# Patient Record
Sex: Female | Born: 1955 | Race: White | Hispanic: No | Smoking: Never smoker
Health system: Southern US, Community
[De-identification: ages and names within clinical notes are randomized; demographics above are authoritative.]

## PROBLEM LIST (undated history)

## (undated) DIAGNOSIS — M797 Fibromyalgia: Secondary | ICD-10-CM

## (undated) DIAGNOSIS — I10 Essential (primary) hypertension: Secondary | ICD-10-CM

## (undated) DIAGNOSIS — M8580 Other specified disorders of bone density and structure, unspecified site: Secondary | ICD-10-CM

## (undated) DIAGNOSIS — G510 Bell's palsy: Secondary | ICD-10-CM

## (undated) DIAGNOSIS — E7800 Pure hypercholesterolemia, unspecified: Secondary | ICD-10-CM

## (undated) DIAGNOSIS — B029 Zoster without complications: Secondary | ICD-10-CM

## (undated) DIAGNOSIS — J111 Influenza due to unidentified influenza virus with other respiratory manifestations: Secondary | ICD-10-CM

## (undated) DIAGNOSIS — D649 Anemia, unspecified: Secondary | ICD-10-CM

## (undated) DIAGNOSIS — R079 Chest pain, unspecified: Secondary | ICD-10-CM

## (undated) HISTORY — PX: TUBAL LIGATION: SHX77

## (undated) HISTORY — PX: APPENDECTOMY: SHX54

## (undated) HISTORY — DX: Fibromyalgia: M79.7

## (undated) HISTORY — DX: Chest pain, unspecified: R07.9

## (undated) HISTORY — DX: Anemia, unspecified: D64.9

## (undated) HISTORY — DX: Other specified disorders of bone density and structure, unspecified site: M85.80

## (undated) HISTORY — DX: Zoster without complications: B02.9

## (undated) HISTORY — PX: SHOULDER SURGERY: SHX246

## (undated) HISTORY — DX: Pure hypercholesterolemia, unspecified: E78.00

## (undated) HISTORY — PX: LAPAROSCOPIC CHOLECYSTECTOMY: SUR755

## (undated) HISTORY — PX: COLONOSCOPY: SHX174

## (undated) HISTORY — DX: Bell's palsy: G51.0

## (undated) HISTORY — DX: Essential (primary) hypertension: I10

## (undated) HISTORY — DX: Influenza due to unidentified influenza virus with other respiratory manifestations: J11.1

---

## 1997-05-19 ENCOUNTER — Other Ambulatory Visit: Admission: RE | Admit: 1997-05-19 | Discharge: 1997-05-19 | Payer: Self-pay | Admitting: *Deleted

## 1997-06-19 ENCOUNTER — Other Ambulatory Visit: Admission: RE | Admit: 1997-06-19 | Discharge: 1997-06-19 | Payer: Self-pay | Admitting: *Deleted

## 1997-06-19 ENCOUNTER — Ambulatory Visit (HOSPITAL_COMMUNITY): Admission: RE | Admit: 1997-06-19 | Discharge: 1997-06-19 | Payer: Self-pay | Admitting: *Deleted

## 1998-05-03 DIAGNOSIS — R079 Chest pain, unspecified: Secondary | ICD-10-CM

## 1998-05-03 HISTORY — DX: Chest pain, unspecified: R07.9

## 1998-05-18 ENCOUNTER — Other Ambulatory Visit: Admission: RE | Admit: 1998-05-18 | Discharge: 1998-05-18 | Payer: Self-pay | Admitting: *Deleted

## 1998-05-18 ENCOUNTER — Ambulatory Visit (HOSPITAL_COMMUNITY): Admission: RE | Admit: 1998-05-18 | Discharge: 1998-05-18 | Payer: Self-pay | Admitting: *Deleted

## 1999-06-17 ENCOUNTER — Other Ambulatory Visit: Admission: RE | Admit: 1999-06-17 | Discharge: 1999-06-17 | Payer: Self-pay | Admitting: *Deleted

## 1999-06-17 ENCOUNTER — Encounter: Payer: Self-pay | Admitting: *Deleted

## 1999-06-17 ENCOUNTER — Ambulatory Visit (HOSPITAL_COMMUNITY): Admission: RE | Admit: 1999-06-17 | Discharge: 1999-06-17 | Payer: Self-pay | Admitting: *Deleted

## 2000-08-24 ENCOUNTER — Other Ambulatory Visit: Admission: RE | Admit: 2000-08-24 | Discharge: 2000-08-24 | Payer: Self-pay | Admitting: *Deleted

## 2000-08-24 ENCOUNTER — Ambulatory Visit (HOSPITAL_COMMUNITY): Admission: RE | Admit: 2000-08-24 | Discharge: 2000-08-24 | Payer: Self-pay | Admitting: *Deleted

## 2000-08-24 ENCOUNTER — Encounter: Payer: Self-pay | Admitting: *Deleted

## 2001-08-26 ENCOUNTER — Encounter: Payer: Self-pay | Admitting: *Deleted

## 2001-08-26 ENCOUNTER — Ambulatory Visit (HOSPITAL_COMMUNITY): Admission: RE | Admit: 2001-08-26 | Discharge: 2001-08-26 | Payer: Self-pay | Admitting: *Deleted

## 2001-08-26 ENCOUNTER — Other Ambulatory Visit: Admission: RE | Admit: 2001-08-26 | Discharge: 2001-08-26 | Payer: Self-pay | Admitting: *Deleted

## 2002-01-13 ENCOUNTER — Other Ambulatory Visit: Admission: RE | Admit: 2002-01-13 | Discharge: 2002-01-13 | Payer: Self-pay | Admitting: *Deleted

## 2002-09-03 ENCOUNTER — Ambulatory Visit (HOSPITAL_COMMUNITY): Admission: RE | Admit: 2002-09-03 | Discharge: 2002-09-03 | Payer: Self-pay | Admitting: *Deleted

## 2002-09-03 ENCOUNTER — Encounter: Payer: Self-pay | Admitting: *Deleted

## 2002-09-03 ENCOUNTER — Other Ambulatory Visit: Admission: RE | Admit: 2002-09-03 | Discharge: 2002-09-03 | Payer: Self-pay | Admitting: *Deleted

## 2003-09-09 ENCOUNTER — Ambulatory Visit (HOSPITAL_COMMUNITY): Admission: RE | Admit: 2003-09-09 | Discharge: 2003-09-09 | Payer: Self-pay | Admitting: *Deleted

## 2003-09-09 ENCOUNTER — Other Ambulatory Visit: Admission: RE | Admit: 2003-09-09 | Discharge: 2003-09-09 | Payer: Self-pay | Admitting: *Deleted

## 2004-06-17 ENCOUNTER — Ambulatory Visit: Payer: Self-pay | Admitting: Family Medicine

## 2004-07-06 ENCOUNTER — Ambulatory Visit: Payer: Self-pay | Admitting: Family Medicine

## 2004-08-02 HISTORY — PX: NECK SURGERY: SHX720

## 2004-08-10 ENCOUNTER — Encounter: Admission: RE | Admit: 2004-08-10 | Discharge: 2004-08-10 | Payer: Self-pay | Admitting: Orthopedic Surgery

## 2004-08-30 ENCOUNTER — Observation Stay (HOSPITAL_COMMUNITY): Admission: RE | Admit: 2004-08-30 | Discharge: 2004-08-31 | Payer: Self-pay | Admitting: Orthopedic Surgery

## 2004-08-30 ENCOUNTER — Encounter (INDEPENDENT_AMBULATORY_CARE_PROVIDER_SITE_OTHER): Payer: Self-pay | Admitting: Specialist

## 2004-09-19 ENCOUNTER — Encounter: Admission: RE | Admit: 2004-09-19 | Discharge: 2004-09-19 | Payer: Self-pay | Admitting: Orthopedic Surgery

## 2005-01-03 ENCOUNTER — Ambulatory Visit: Payer: Self-pay | Admitting: Family Medicine

## 2005-04-06 ENCOUNTER — Other Ambulatory Visit: Admission: RE | Admit: 2005-04-06 | Discharge: 2005-04-06 | Payer: Self-pay | Admitting: Obstetrics & Gynecology

## 2005-04-06 ENCOUNTER — Ambulatory Visit (HOSPITAL_COMMUNITY): Admission: RE | Admit: 2005-04-06 | Discharge: 2005-04-06 | Payer: Self-pay | Admitting: Obstetrics & Gynecology

## 2006-04-10 ENCOUNTER — Other Ambulatory Visit: Admission: RE | Admit: 2006-04-10 | Discharge: 2006-04-10 | Payer: Self-pay | Admitting: Obstetrics & Gynecology

## 2006-04-19 ENCOUNTER — Encounter: Admission: RE | Admit: 2006-04-19 | Discharge: 2006-04-19 | Payer: Self-pay | Admitting: Obstetrics & Gynecology

## 2006-04-24 ENCOUNTER — Encounter: Admission: RE | Admit: 2006-04-24 | Discharge: 2006-04-24 | Payer: Self-pay | Admitting: Gastroenterology

## 2007-03-17 ENCOUNTER — Encounter: Admission: RE | Admit: 2007-03-17 | Discharge: 2007-03-17 | Payer: Self-pay | Admitting: Orthopedic Surgery

## 2007-05-01 ENCOUNTER — Other Ambulatory Visit: Admission: RE | Admit: 2007-05-01 | Discharge: 2007-05-01 | Payer: Self-pay | Admitting: Obstetrics & Gynecology

## 2007-05-01 ENCOUNTER — Ambulatory Visit (HOSPITAL_COMMUNITY): Admission: RE | Admit: 2007-05-01 | Discharge: 2007-05-01 | Payer: Self-pay | Admitting: Obstetrics & Gynecology

## 2008-05-13 ENCOUNTER — Other Ambulatory Visit: Admission: RE | Admit: 2008-05-13 | Discharge: 2008-05-13 | Payer: Self-pay | Admitting: Obstetrics & Gynecology

## 2008-05-13 ENCOUNTER — Ambulatory Visit (HOSPITAL_COMMUNITY): Admission: RE | Admit: 2008-05-13 | Discharge: 2008-05-13 | Payer: Self-pay | Admitting: Obstetrics & Gynecology

## 2008-06-25 ENCOUNTER — Ambulatory Visit (HOSPITAL_COMMUNITY): Admission: RE | Admit: 2008-06-25 | Discharge: 2008-06-25 | Payer: Self-pay | Admitting: Cardiovascular Disease

## 2008-08-17 ENCOUNTER — Encounter: Admission: RE | Admit: 2008-08-17 | Discharge: 2008-08-17 | Payer: Self-pay | Admitting: Orthopedic Surgery

## 2009-05-17 ENCOUNTER — Ambulatory Visit (HOSPITAL_COMMUNITY): Admission: RE | Admit: 2009-05-17 | Discharge: 2009-05-17 | Payer: Self-pay | Admitting: Obstetrics & Gynecology

## 2010-05-20 NOTE — Discharge Summary (Signed)
NAMELAURINA, Misty Haney                ACCOUNT NO.:  [id number]   MEDICAL RECORD NO.:  [id number]          PATIENT TYPE:  INP   LOCATION:  5028                         FACILITY:  [facility]   PHYSICIAN:  Nelda Severe, MD      DATE OF BIRTH:  06-07-1955   DATE OF ADMISSION:  08/30/2004  DATE OF DISCHARGE:  08/31/2004                                 DISCHARGE SUMMARY   HOSPITAL COURSE:  This woman was admitted for management of severe neck pain  associated with C6-7 spondylosis.  The day of admission she was taken to the  operating room where anterior cervical disk excision, decompression and  fusion were carried out.  Postoperatively, she vomited continuously during  the night.  On assessment this morning around 8:30, she had stopped vomiting  and was still quite nauseated, and fairly weak.  It was clearly impossible  to arrange for her discharge at that time.   She has been assessed this evening around 7 p.m.  The nausea has cleared.  She has tolerated clear fluids without difficulty.  Her pain control was  satisfactory, actually not taking practically any analgesia at all.   Her wounds look fine.  Her Penrose drain was removed this morning.   At the present time, she may be discharged.   She will be given a prescription for 50 Vicodin one to two q.6 h. p.r.n.   She may shower.  She may remove the iliac crest dressing from her bone graft  harvest site in 2 days' time.  It is currently occluded with OpSite.  She  can get it wet after that and she can get her cervical wound wet at this  point.  She is to remain on a soft diet, avoiding solid chunky foods over  the next week.  She is to call the office for any persistent fever or wound  drainage.   She is to avoid bending or lifting.  She is to wear her Aspen collar with  which she has been provided when she is out of the house.   She is to call the office and make an appointment for the second week in  September for reevaluation  including examination and x-rays.   At the present time, her condition is one of improved pain compared with  what she had preoperatively.  She does have some posterior neck pain at the  present time, but it not severe.  She is ambulatory and able to void, etc.   DIAGNOSIS:  C6-7 spondylosis.   CONDITION ON DISCHARGE:  Improved pain, wound stable, ambulatory.   DISCHARGE MEDICATIONS:  Vicodin as described above.   FOLLOWUP:  As described above.   PRECAUTIONS AND RESTRICTIONS:  As described above.      Nelda Severe, MD  Electronically Signed     MT/MEDQ  D:  08/31/2004  T:  09/01/2004  Job:  [id number]

## 2010-05-20 NOTE — Op Note (Signed)
NAMELEISL, SPURRIER NO.:  [id number]   MEDICAL RECORD NO.:  [id number]          PATIENT TYPE:  INP   LOCATION:  5028                         FACILITY:  [facility]   PHYSICIAN:  Nelda Severe, MD      DATE OF BIRTH:  1955-08-20   DATE OF PROCEDURE:  DATE OF DISCHARGE:                                 OPERATIVE REPORT   SURGEON:  Nelda Severe, M.D.   ASSISTANT:  Barry Dienes, R.N.   PREOPERATIVE DIAGNOSIS:  C6-7 spondylosis.   POSTOPERATIVE DIAGNOSIS:  C6-7 spondylosis.   OPERATIVE PROCEDURE:  Anterior cervical decompression and fusion at C6-7  utilizing interbody cage, autogenous graft and anterior plate and screws at  C6-7.   OPERATIVE NOTE:  The patient was placed under general endotracheal  anesthesia.  Prophylactic antibiotics were administered intravenously.  She  was positioned supine on a regular operating table.  Folded sheets were  placed transversely across the shoulder blades to allow for extension of the  neck.  The head was supported in a foam donut.  The arms were tucked at the  sides.  A bump was placed under the left hip to raise the anterior iliac  crest for bone graft harvest.   The head halter was applied and attached with the sticky edge a 1000 drape.  The anterior cervical area and left anterior iliac crest area were prepped  with DuraPrep and draped in square fashion.  The drapes were secured with  strips of Ioban.   Prior to prepping and draping, a cross-table lateral radiograph had been  taken with an 18-gauge needle taped to the skin in order to aid in judging  the level of the incision.  This approximated the level of C5.  Accordingly, a transverse incision was made a little distally extending from  the midline to the left side just behind the anterior border of the  sternocleidomastoid.  The platysma was cut in line with the incision.  The  anterior border of the sternocleidomastoid was identified and the cervical  fascia  dissected bluntly down to the anterior aspect of the spinal column.  The carotid sheath was palpated and kept laterally.  The esophagus and  trachea were retracted medially with a handheld Cloward retractor.  The  midline fascia was incised with Metzenbaum scissors.  There was one obvious  disk space associated with large anterior spondylophytes, which was presumed  and proved to be the correct level at C6-7.   A cross-table lateral radiograph was taken with a marker needle in the C5-6  disk, and this confirmed our correct level, at one level below the marked  level.   Once the longus colli muscles were mobilized using cutting current, a  shallow line self-retaining retractor was placed deep with the blades placed  deep to the longus colli muscles on both sides.   The anterior spondylophyte was then removed using a Leksell rongeur to  reveal the anterior disk space.  Fragments of disk were then stripped out piecemeal using curettes and  pituitary rongeurs.   A high-speed bur was then used to remove  anterior inferior lip of the C6  vertebra and further remove endplate cartilage posteriorly.  Endplate  cartilage was also removed using the high-speed bur from the upper end plate  of C7.  Posteriorly, burring was carried back until there was a small shelf,  about 1.5 mm thick.  This was then removed with 2-mm Kerrison rongeur from  both C6 and C7 vertebra.  We then exposed the dura deep to the posterior  longitudinal ligament and removed more disk and ligament back to dura.  Foraminotomies were performed bilaterally using a Kerrison rongeur.   While waiting for the localizing radiograph which was taken, we did harvest  anterior iliac crest bone graft from the left side.  This was done through a  short incision made distal to the bone graft harvest site and then mobilized  proximally so as to leave the incision at a point not over a bony  prominence.  The insertion of the external  oblique fascia and gluteal fascia was  mobilized medially and laterally off the top of the crest, about 3-4 cm  posterior to the anterior superior iliac spine.  The top the crest was  fenestrated with a small osteotome and then a narrow gouge and curettes used  to remove a satisfactory quantity of cancellous bone for placement of an  interbody cage.  This wound was then irrigated and the bony defect packed  with Gelfoam.   Having prepared the disk space and decompressed the neural foramina and  spinal canal, sizers for the interbody cages were then tried including  neutral and 5-degree lordotic sizers.  An 8 mm lordotic sizer was chosen.  The cage was then packed with cancellus graft.  While the anesthesiologist  applied longitudinal traction through the head halter, the interbody cage  containing bone graft was gently impacted into place.  Prior to this I had  measured the depth the vertebral bodies, which was only 14 mm.  Therefore  the cage, measuring 11 mm in depth, was not countersunk.   We then chose the appropriate length four-hole plate, which was applied  anteriorly and attached with self-drilling/self-tapping screws.  Cross-table  lateral radiograph was taken to check for position of plate and screws and  was somewhat overpenetrated, least to the extent that it was difficult for  me to evaluate it in the operating room.  It was sent to the x-ray  department, where the radiologist reviewed and reported that the level was  correct, etc.   A quarter-inch Penrose drain was then placed in the wound.  The platysma was  closed using interrupted 2-0 Vicryl suture and the skin closed using a  subcuticular continuous 3-0 running suture.  The drain was secured with a 3-  0 nylon suture.   The fascial layer of the hip wound was closed using interrupted figure-of-  eight 0 Vicryl suture.  The subcutaneous tissue was closed using interrupted 2-0 Vicryl suture and the skin using a  subcuticular 3-0 running Vicryl  suture.  The skin edges of both wounds were reinforced with Steri-Strips.  A  gauze dressing was applied and secured with OpSite to the iliac crest wound  and a gauze dressing applied to the cervical wound and held in place with a  surgical towel placed around the neck and taped in fairly loose fashion.   The patient was then taken off the table, awakened and returned to the  recovery room, where she could actively move and move both upper and lower  extremities without difficulty.  There was no evidence of neurologic deficit  postoperatively.   Blood loss was estimated at 100 mL or less.  There were no intraoperative  complications.  The sponge and needle counts were correct.      Nelda Severe, MD  Electronically Signed     MT/MEDQ  D:  08/31/2004  T:  09/01/2004  Job:  [id number]

## 2010-07-05 ENCOUNTER — Other Ambulatory Visit: Payer: Self-pay | Admitting: Obstetrics & Gynecology

## 2010-07-05 DIAGNOSIS — Z1231 Encounter for screening mammogram for malignant neoplasm of breast: Secondary | ICD-10-CM

## 2010-07-14 ENCOUNTER — Ambulatory Visit (HOSPITAL_COMMUNITY)
Admission: RE | Admit: 2010-07-14 | Discharge: 2010-07-14 | Disposition: A | Discharge status: Home / Self Care | Payer: BC Managed Care – PPO | Source: Ambulatory Visit | Attending: Obstetrics & Gynecology | Admitting: Obstetrics & Gynecology

## 2010-07-14 DIAGNOSIS — Z1231 Encounter for screening mammogram for malignant neoplasm of breast: Secondary | ICD-10-CM

## 2011-10-10 ENCOUNTER — Other Ambulatory Visit: Payer: Self-pay | Admitting: Obstetrics & Gynecology

## 2011-10-10 DIAGNOSIS — Z1231 Encounter for screening mammogram for malignant neoplasm of breast: Secondary | ICD-10-CM

## 2011-10-11 ENCOUNTER — Ambulatory Visit (HOSPITAL_COMMUNITY)
Admission: RE | Admit: 2011-10-11 | Discharge: 2011-10-11 | Disposition: A | Discharge status: Home / Self Care | Payer: BC Managed Care – PPO | Source: Ambulatory Visit | Attending: Obstetrics & Gynecology | Admitting: Obstetrics & Gynecology

## 2011-10-11 DIAGNOSIS — Z1231 Encounter for screening mammogram for malignant neoplasm of breast: Secondary | ICD-10-CM | POA: Insufficient documentation

## 2012-01-03 DIAGNOSIS — J111 Influenza due to unidentified influenza virus with other respiratory manifestations: Secondary | ICD-10-CM

## 2012-01-03 HISTORY — DX: Influenza due to unidentified influenza virus with other respiratory manifestations: J11.1

## 2012-10-14 ENCOUNTER — Encounter: Payer: Self-pay | Admitting: Obstetrics & Gynecology

## 2012-10-14 ENCOUNTER — Ambulatory Visit (INDEPENDENT_AMBULATORY_CARE_PROVIDER_SITE_OTHER): Payer: BC Managed Care – PPO | Admitting: Obstetrics & Gynecology

## 2012-10-14 VITALS — BP 148/82 | HR 72 | Resp 16 | Ht 60.0 in | Wt 142.8 lb

## 2012-10-14 DIAGNOSIS — Z01419 Encounter for gynecological examination (general) (routine) without abnormal findings: Secondary | ICD-10-CM

## 2012-10-14 DIAGNOSIS — E2839 Other primary ovarian failure: Secondary | ICD-10-CM

## 2012-10-14 DIAGNOSIS — Z0000 Encounter for general adult medical examination without abnormal findings: Secondary | ICD-10-CM

## 2012-10-14 MED ORDER — LEVOTHYROXINE SODIUM 50 MCG PO TABS: 50.0000 ug | ORAL_TABLET | Freq: Every day | ORAL | Status: DC

## 2012-10-14 NOTE — Patient Instructions (Signed)

## 2012-10-14 NOTE — Progress Notes (Signed)
57 y.o. G3P2 MarriedCaucasianF here for annual exam.  Doing well.  Oldest granddaughter is a Printmaker at [employer], on [address].  No vaginal bleeding.    Patient's last menstrual period was 01/03/2008.          Sexually active: yes  The current method of family planning is none.    Exercising: yes  walk and yard work Smoker:  no  Health Maintenance: Pap:  10/11/11 WNL/negative HR HPV History of abnormal Pap:  no MMG:  10/11/11 normal Colonoscopy:  2009, neg.  Dr. Loreta Ave 5 years (due to mother with metastatic cancer--unknown primary).  Pt declines now.   BMD:   2009 TDaP:  Will have PCP send record Screening Labs: done today, Hb today: 12.9, Urine today: WBC-trace   reports that she has never smoked. She has never used smokeless tobacco. She reports that she does not drink alcohol or use illicit drugs.  Past Medical History  Diagnosis Date  . Anemia   . Chest pain 5/00    negative cardiac cath  . Fibromyalgia   . Osteopenia   . Hypertension   . Swine flu 1/14    Past Surgical History  Procedure Laterality Date  . Appendectomy    . Tubal ligation    . Laparoscopic cholecystectomy    . Neck surgery  8/06    secondary disc degeneration  . Colonoscopy      Current Outpatient Prescriptions  Medication Sig Dispense Refill  . Coenzyme Q10 (CO Q-10 PO) Take by mouth daily.      . Flaxseed, Linseed, (FLAX SEEDS PO) Take 450 mg by mouth. Two daily      . KRILL OIL PO Take 500 mg by mouth daily.      Marland Kitchen levothyroxine (SYNTHROID, LEVOTHROID) 50 MCG tablet 50 mcg daily.      . Multiple Vitamins-Minerals (MULTIVITAMIN PO) Take by mouth daily.       No current facility-administered medications for this visit.    Family History  Problem Relation Age of Onset  . Diabetes Mother   . Cancer Mother     ovarian, breast, liver-? primary site  . Irritable bowel syndrome Mother   . Breast cancer Maternal Aunt     and stomach cancer  . Lung cancer Father   . Prostate cancer  Paternal Grandfather   . Brain cancer Maternal Grandmother     ROS:  Pertinent items are noted in HPI.  Otherwise, a comprehensive ROS was negative.  Exam:   BP 148/82  Pulse 72  Resp 16  Ht 5' (1.524 m)  Wt 142 lb 12.8 oz (64.774 kg)  BMI 27.89 kg/m2  LMP 01/03/2008  Weight change: -3lbs   Height: 5' (152.4 cm)  Ht Readings from Last 3 Encounters:  10/14/12 5' (1.524 m)    General appearance: alert, cooperative and appears stated age Head: Normocephalic, without obvious abnormality, atraumatic Neck: no adenopathy, supple, symmetrical, trachea midline and thyroid normal to inspection and palpation Lungs: clear to auscultation bilaterally Breasts: normal appearance, no masses or tenderness Heart: regular rate and rhythm Abdomen: soft, non-tender; bowel sounds normal; no masses,  no organomegaly Extremities: extremities normal, atraumatic, no cyanosis or edema Skin: Skin color, texture, turgor normal. No rashes or lesions Lymph nodes: Cervical, supraclavicular, and axillary nodes normal. No abnormal inguinal nodes palpated Neurologic: Grossly normal   Pelvic: External genitalia:  no lesions              Urethra:  normal appearing urethra with no  masses, tenderness or lesions              Bartholins and Skenes: normal                 Vagina: normal appearing vagina with normal color and discharge, no lesions              Cervix: no lesions              Pap taken: no Bimanual Exam:  Uterus:  normal size, contour, position, consistency, mobility, non-tender              Adnexa: normal adnexa and no mass, fullness, tenderness               Rectovaginal: Confirms               Anus:  normal sphincter tone, no lesions  A:  Well Woman with normal exam H/o 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  Ultrasound 08/22/11 was stable.   Osteopenia Elevated lipids Hypothyroidism Borderline hypertension  P:   Mammogram yearly.  Pt will schedule. Aware I am unsure about tetanus  shot.  She it aware to get this with any risk. Rx for levothyroxine qd #90/4 RF TSH, Vit D, Lipids, CMP  pap smear with neg HR HPV 10/13 Order for BMD return annually or prn  An After Visit Summary was printed and given to the patient.

## 2012-10-15 LAB — POCT URINALYSIS DIPSTICK
Blood, UA: NEGATIVE
Glucose, UA: NEGATIVE
pH, UA: 5

## 2012-10-15 LAB — COMPREHENSIVE METABOLIC PANEL
ALT: 11 U/L (ref 0–35)
AST: 17 U/L (ref 0–37)
Alkaline Phosphatase: 55 U/L (ref 39–117)
BUN: 7 mg/dL (ref 6–23)
Calcium: 9.4 mg/dL (ref 8.4–10.5)
Chloride: 104 mEq/L (ref 96–112)
Creat: 0.56 mg/dL (ref 0.50–1.10)
Glucose, Bld: 81 mg/dL (ref 70–99)
Potassium: 3.6 mEq/L (ref 3.5–5.3)
Total Bilirubin: 0.7 mg/dL (ref 0.3–1.2)
Total Protein: 7.1 g/dL (ref 6.0–8.3)

## 2012-10-15 LAB — VITAMIN D 25 HYDROXY (VIT D DEFICIENCY, FRACTURES): Vit D, 25-Hydroxy: 35 ng/mL (ref 30–89)

## 2012-10-15 NOTE — Addendum Note (Signed)
Addended by: Elisha Headland on: 10/15/2012 10:10 AM   Modules accepted: Orders

## 2012-10-17 ENCOUNTER — Telehealth: Payer: Self-pay

## 2012-10-17 NOTE — Telephone Encounter (Signed)
Lmtcb//kn 

## 2012-10-17 NOTE — Telephone Encounter (Signed)
Message copied by Elisha Headland on Thu Oct 17, 2012  2:29 PM ------      Message from: Jerene Bears      Created: Tue Oct 15, 2012  6:04 AM       Please inform TSH, CMP nl.  Lipids are elevated.  LDLs right at 160.  I agree with PCP that this is in range for being treated.  Needs to f/u with PCP for discussion. ------

## 2012-10-17 NOTE — Telephone Encounter (Signed)
Patient is returning a call to Kelly °

## 2012-10-18 NOTE — Telephone Encounter (Signed)
Pt returning your call. Ok to leave a detailed message about whatever it is.

## 2012-10-22 NOTE — Telephone Encounter (Signed)
Spoke with patient re lab results.

## 2012-10-28 ENCOUNTER — Other Ambulatory Visit: Payer: Self-pay | Admitting: Obstetrics & Gynecology

## 2012-10-28 DIAGNOSIS — Z1231 Encounter for screening mammogram for malignant neoplasm of breast: Secondary | ICD-10-CM

## 2012-11-13 ENCOUNTER — Ambulatory Visit (HOSPITAL_COMMUNITY): Payer: BC Managed Care – PPO

## 2012-11-13 ENCOUNTER — Other Ambulatory Visit (HOSPITAL_COMMUNITY): Payer: BC Managed Care – PPO

## 2012-12-10 ENCOUNTER — Ambulatory Visit (HOSPITAL_COMMUNITY)
Admission: RE | Admit: 2012-12-10 | Discharge: 2012-12-10 | Disposition: A | Discharge status: Home / Self Care | Payer: BC Managed Care – PPO | Source: Ambulatory Visit | Attending: Obstetrics & Gynecology | Admitting: Obstetrics & Gynecology

## 2012-12-10 DIAGNOSIS — E2839 Other primary ovarian failure: Secondary | ICD-10-CM | POA: Insufficient documentation

## 2012-12-10 DIAGNOSIS — Z1231 Encounter for screening mammogram for malignant neoplasm of breast: Secondary | ICD-10-CM | POA: Insufficient documentation

## 2012-12-10 DIAGNOSIS — Z13820 Encounter for screening for osteoporosis: Secondary | ICD-10-CM | POA: Insufficient documentation

## 2012-12-12 ENCOUNTER — Ambulatory Visit: Payer: Self-pay | Admitting: Obstetrics & Gynecology

## 2013-10-29 ENCOUNTER — Other Ambulatory Visit: Payer: Self-pay | Admitting: Obstetrics & Gynecology

## 2013-10-29 DIAGNOSIS — Z1231 Encounter for screening mammogram for malignant neoplasm of breast: Secondary | ICD-10-CM

## 2013-11-03 ENCOUNTER — Encounter: Payer: Self-pay | Admitting: Obstetrics & Gynecology

## 2013-11-04 ENCOUNTER — Ambulatory Visit (INDEPENDENT_AMBULATORY_CARE_PROVIDER_SITE_OTHER): Payer: BC Managed Care – PPO | Admitting: Obstetrics & Gynecology

## 2013-11-04 ENCOUNTER — Encounter: Payer: Self-pay | Admitting: Obstetrics & Gynecology

## 2013-11-04 ENCOUNTER — Ambulatory Visit: Payer: BC Managed Care – PPO | Admitting: Obstetrics & Gynecology

## 2013-11-04 VITALS — BP 138/72 | HR 64 | Resp 16 | Ht 60.0 in | Wt 151.0 lb

## 2013-11-04 DIAGNOSIS — Z124 Encounter for screening for malignant neoplasm of cervix: Secondary | ICD-10-CM

## 2013-11-04 DIAGNOSIS — Z01419 Encounter for gynecological examination (general) (routine) without abnormal findings: Secondary | ICD-10-CM

## 2013-11-04 NOTE — Progress Notes (Signed)
58 y.o. G3P2 MarriedCaucasianF here for annual exam.  Doing well.  Had influenza in January.  Has gotten her flu shot this year.  No vaginal bleeding.    Pt reports she started a new cholesterol medication and she started having hip and pelvic pain.  She stopped the medication and it has gotten better.  Reports husband had a bad cough that has been present for several months.  Last week, he started having some SOB.  Saw pulmonologist in [place].  Has had a CT scan.  Saw a cardiologist.  Has pulmonary fibrosis, late staged.  Has had 20 doctors visits since the end of late September.  Has already started the process for disability.  He is on oxygen all of the time now.  Has also been on prednisone as well.  Blood sugars are being monitored as well.  Pt reports this has been so very stressful for the both of them.    Patient's last menstrual period was 01/03/2008.          Sexually active: Yes.    The current method of family planning is post menopausal status.    Exercising: No.  not regularly Smoker:  no  Health Maintenance: Pap:  10/11/11 WNL/negative HR HPV History of abnormal Pap:  no MMG:  12/10/12 3D-normal Colonoscopy:  2009.  Dr. Loreta AveMann said repeat 5 years due to mother's hx (ovarian/breast/liver).  Saw GI in [place]--Dr Charm BargesButler.  Was advisedt to repeat in 10 years BMD:   02/11/12.  Osteopenia.  Repeat 2 year.  [facility]. TDaP:  5/08 Screening Labs: PCP, Hb today: PCP, Urine today: PCP   reports that she has never smoked. She has never used smokeless tobacco. She reports that she does not drink alcohol or use illicit drugs.  Past Medical History  Diagnosis Date  . Anemia   . Chest pain 5/00    negative cardiac cath  . Fibromyalgia   . Osteopenia   . Hypertension   . Swine flu 1/14    Past Surgical History  Procedure Laterality Date  . Appendectomy    . Tubal ligation    . Laparoscopic cholecystectomy    . Neck surgery  8/06    secondary disc degeneration  .  Colonoscopy    . Cortisone injection      x 4 in left foot, ? diag with bursitis    Current Outpatient Prescriptions  Medication Sig Dispense Refill  . amLODipine (NORVASC) 5 MG tablet   0  . Coenzyme Q10 (CO Q-10 PO) Take by mouth daily.    Marland Kitchen. dicyclomine (BENTYL) 10 MG capsule   0  . Flaxseed, Linseed, (FLAX SEEDS PO) Take 450 mg by mouth. Two daily    . KRILL OIL PO Take 500 mg by mouth daily.    Marland Kitchen. levothyroxine (SYNTHROID, LEVOTHROID) 50 MCG tablet Take 1 tablet (50 mcg total) by mouth daily. 90 tablet 4  . Multiple Vitamins-Minerals (MULTIVITAMIN PO) Take by mouth daily.     No current facility-administered medications for this visit.    Family History  Problem Relation Age of Onset  . Diabetes Mother   . Cancer Mother     ovarian, breast, liver-? primary site  . Irritable bowel syndrome Mother   . Breast cancer Maternal Aunt     and stomach cancer  . Lung cancer Father   . Prostate cancer Paternal Grandfather   . Brain cancer Maternal Grandmother     ROS:  Pertinent items are noted  in HPI.  Otherwise, a comprehensive ROS was negative.  Exam:   BP 138/72 mmHg  Pulse 64  Resp 16  Ht 5' (1.524 m)  Wt 151 lb (68.493 kg)  BMI 29.49 kg/m2  LMP 01/03/2008  Weight change: +6#   Height: 5' (152.4 cm)  Ht Readings from Last 3 Encounters:  11/04/13 5' (1.524 m)  10/14/12 5' (1.524 m)    General appearance: alert, cooperative and appears stated age Head: Normocephalic, without obvious abnormality, atraumatic Neck: no adenopathy, supple, symmetrical, trachea midline and thyroid normal to inspection and palpation Lungs: clear to auscultation bilaterally Breasts: normal appearance, no masses or tenderness Heart: regular rate and rhythm Abdomen: soft, non-tender; bowel sounds normal; no masses,  no organomegaly Extremities: extremities normal, atraumatic, no cyanosis or edema Skin: Skin color, texture, turgor normal. No rashes or lesions Lymph nodes: Cervical,  supraclavicular, and axillary nodes normal. No abnormal inguinal nodes palpated Neurologic: Grossly normal   Pelvic: External genitalia:  no lesions              Urethra:  normal appearing urethra with no masses, tenderness or lesions              Bartholins and Skenes: normal                 Vagina: normal appearing vagina with normal color and discharge, no lesions              Cervix: no lesions              Pap taken: Yes.   Bimanual Exam:  Uterus:  normal size, contour, position, consistency, mobility, non-tender              Adnexa: normal adnexa and no mass, fullness, tenderness               Rectovaginal: Confirms               Anus:  normal sphincter tone, no lesions  A:  Well Woman with normal exam H/o 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 Ultrasound 08/22/11 was stable. Watching conservatively. Osteopenia Elevated lipids Hypothyroidism Borderline hypertension  P: Mammogram yearly. Pt aware. Labs with PCP two months ago. pap smear with neg HR HPV 10/13.  Pap today. Repeat BMD 2 year return annually or prn

## 2013-11-07 LAB — IPS PAP TEST WITH REFLEX TO HPV

## 2013-12-11 ENCOUNTER — Ambulatory Visit (HOSPITAL_COMMUNITY)
Admission: RE | Admit: 2013-12-11 | Discharge: 2013-12-11 | Disposition: A | Discharge status: Home / Self Care | Payer: BC Managed Care – PPO | Source: Ambulatory Visit | Attending: Obstetrics & Gynecology | Admitting: Obstetrics & Gynecology

## 2013-12-11 DIAGNOSIS — Z1231 Encounter for screening mammogram for malignant neoplasm of breast: Secondary | ICD-10-CM | POA: Diagnosis present

## 2014-04-21 ENCOUNTER — Telehealth: Payer: Self-pay | Admitting: Obstetrics & Gynecology

## 2014-04-21 DIAGNOSIS — R1030 Lower abdominal pain, unspecified: Secondary | ICD-10-CM

## 2014-04-21 NOTE — Telephone Encounter (Signed)
I am happy to have her come for an ultrasound but it is unlikely this is causing any symptoms.  I know pt well and she will be reassured if the ultrasound shows no changes, so ok to schedule.

## 2014-04-21 NOTE — Telephone Encounter (Signed)
Left message to call Kaitlyn at [phone]. 

## 2014-04-21 NOTE — Telephone Encounter (Signed)
Spoke with patient. Advised of message as seen below from Dr.Miller. Patient is agreeable. Appointment scheduled for PUS on 04/23/2014 at 1:30pm with 2pm consult with Dr.Miller. Patient is agreeable to date and time. Order placed for precert.  Routing to provider for final review. Patient agreeable to disposition. Will close encounter

## 2014-04-21 NOTE — Telephone Encounter (Signed)
Pt has multiple concerns she would like to discuss/have appointment scheduled. Pts insurance is a factor. Pt referenced repeat ultrasound and stomach issues.  Pt requests detailed voicemail with good time to call back as pt will be in and out of home today.

## 2014-04-21 NOTE — Telephone Encounter (Signed)
Spoke with patient. Patient states "I have been having a lot of stomach pain. I went to my primary doctor who diagnosed me with H.Pylori. I was put on antibiotics and completed them last week. I am still having some stomach pain and a sore spot in my stomach. Dr.Miller has been following me with ultrasounds every couple of years. I do not know if I need to have another ultrasound to see what is going on and then go from there?" Patient has GI in area but prefers to see Dr.Miller for PUS before scheduling that appointment. Patient's last PUS was 08/22/2011 which was stable. H/O 12mm avascular solid area on left ovary. Advised will need to speak with Dr.Miller regarding follow up recommendations and return call. Patient is agreeable.

## 2014-04-23 ENCOUNTER — Ambulatory Visit (INDEPENDENT_AMBULATORY_CARE_PROVIDER_SITE_OTHER): Payer: BLUE CROSS/BLUE SHIELD | Admitting: Obstetrics & Gynecology

## 2014-04-23 ENCOUNTER — Encounter: Payer: Self-pay | Admitting: Obstetrics & Gynecology

## 2014-04-23 ENCOUNTER — Ambulatory Visit (INDEPENDENT_AMBULATORY_CARE_PROVIDER_SITE_OTHER): Payer: BLUE CROSS/BLUE SHIELD

## 2014-04-23 VITALS — BP 122/88 | Resp 18 | Ht 60.0 in | Wt 150.0 lb

## 2014-04-23 DIAGNOSIS — R140 Abdominal distension (gaseous): Secondary | ICD-10-CM | POA: Diagnosis not present

## 2014-04-23 DIAGNOSIS — R1030 Lower abdominal pain, unspecified: Secondary | ICD-10-CM

## 2014-04-23 DIAGNOSIS — R109 Unspecified abdominal pain: Secondary | ICD-10-CM | POA: Insufficient documentation

## 2014-04-23 NOTE — Addendum Note (Signed)
Addended by: Joeseph AmorFAST, Elasia Furnish L on: 04/23/2014 04:47 PM   Modules accepted: Orders

## 2014-04-23 NOTE — Progress Notes (Signed)
Called Dr. Kenna GilbertMann's office to schedule appointment, Dr. Kenna GilbertMann's office has transferred records to [facility]. Dr. Loreta AveMann will need to review records prior to scheduling appointment. Records release obtained and faxed to [facility].

## 2014-04-23 NOTE — Progress Notes (Signed)
59 y.o. Married White female here for a pelvic ultrasound.  Pt is having increased abdominal pain.  Pt describes this as across her entire abdomen with some concentration on the left side.  She was tested in PCP's office for H Pyori.  Was treated with Biaxin, Amox, Protonix.  This hasn't really helped.  What pt reports has helped is Dicyclomine 10mg  that she takes when needed for pain.  Having increased bloating with some nausea as well.    Last colonoscopy done by Dr. Loreta AveMann 2009.  Would like referral back again.  Repeat colonoscopy was recommended in 5 years.  Did see another GI in [place] a few years ago due to office being closer to home.  Was told didn't need repeat colonoscopy for 10 years.  This makes pt uncomfortable.   Patient's last menstrual period was 01/03/2008.  Sexually active:  yes  Contraception: PMP state  FINDINGS: UTERUS: 5.6 x 3.7 x 2.5cm EMS: 2.642mm ADNEXA:   Left ovary 1.8 x 1.2 x 1.0cm.  No cysts or masses.   Right ovary 2.4 x 1.6 x 1.2cm.  No cysts or masses. CUL DE SAC: no free fluid  Reviewed images with patient.  No findings on ovary noted.  Uterus and endometrium appear normal.  I feel pt's symptoms are very GI related.  This may just all be IBS.  Pt's husband diagnosed with pulmonary fibrosis and is now on disability.  This has been stressful for pt.  She is taking husband to/from doctor's visits and reports she has gained 15 pounds.  She thinks this is stress related.    Assessment:  Abdominal pain and increased bloating Plan: Will refer back to Dr. Loreta AveMann for possible colonoscopy and endoscopy.  ~15 minutes spent with patient >50% of time was in face to face discussion of above.

## 2014-04-29 ENCOUNTER — Telehealth: Payer: Self-pay | Admitting: Obstetrics & Gynecology

## 2014-04-29 NOTE — Telephone Encounter (Signed)
Called Dr. Kenna GilbertMann's office. Reception is going to look for records and return call. Advised can speak with myself or Sabrina for referrals.

## 2014-04-29 NOTE — Telephone Encounter (Signed)
Patient checking to see if records from [facility] Dr Silvana NewnessButler's office has been sent to Dr Loreta AveMann.

## 2014-04-30 NOTE — Telephone Encounter (Signed)
Spoke with patient. Advised that records were faxed from our office to Dr. Kenna GilbertMann's office yesterday.  Advised Dr. Loreta AveMann will need to review them personally and when we receive response from her office, will be in contact to schedule. Patient states she will out of town next week.  Advised patient we will contact her when we hear back from Dr. Kenna GilbertMann's office.

## 2014-05-05 NOTE — Telephone Encounter (Signed)
Called Dr. Kenna GilbertMann's office, per receptionist, paperwork was on Dr. Kenna GilbertMann's desk and reviewed yesterday and states that patient can re-establish care.  Called patient and detailed message left on home phone answering machine to advise to call Dr. Kenna GilbertMann's office at [phone] and can speak with Misty StanleyLisa or [place] regarding scheduling an appointment.

## 2014-05-14 ENCOUNTER — Other Ambulatory Visit: Payer: Self-pay | Admitting: Gastroenterology

## 2014-05-14 DIAGNOSIS — R1084 Generalized abdominal pain: Secondary | ICD-10-CM

## 2014-05-18 ENCOUNTER — Telehealth: Payer: Self-pay

## 2014-05-18 NOTE — Telephone Encounter (Signed)
Lmtcb//kn 

## 2014-05-18 NOTE — Telephone Encounter (Signed)
Patient is returning a call to [place]. Patient says you can leave details on her voice mail.

## 2014-05-19 ENCOUNTER — Ambulatory Visit (HOSPITAL_COMMUNITY)
Admission: RE | Admit: 2014-05-19 | Discharge: 2014-05-19 | Disposition: A | Discharge status: Home / Self Care | Payer: BLUE CROSS/BLUE SHIELD | Source: Ambulatory Visit | Attending: Gastroenterology | Admitting: Gastroenterology

## 2014-05-19 ENCOUNTER — Encounter (HOSPITAL_COMMUNITY): Payer: Self-pay

## 2014-05-19 DIAGNOSIS — R110 Nausea: Secondary | ICD-10-CM | POA: Insufficient documentation

## 2014-05-19 DIAGNOSIS — R1013 Epigastric pain: Secondary | ICD-10-CM | POA: Insufficient documentation

## 2014-05-19 DIAGNOSIS — R1084 Generalized abdominal pain: Secondary | ICD-10-CM

## 2014-05-19 MED ORDER — IOHEXOL 300 MG/ML  SOLN
100.0000 mL | Freq: Once | INTRAMUSCULAR | Status: AC | PRN
  Administered 2014-05-19: 100 mL via INTRAVENOUS

## 2014-05-19 NOTE — Telephone Encounter (Signed)
Left detailed voice mail, ok per DPR and patient, asking if she has gotten an appointment for Dr Mann.//kn

## 2014-05-19 NOTE — Telephone Encounter (Signed)
Left detailed message with information, ok per DPR and patient.//kn

## 2014-05-20 NOTE — Telephone Encounter (Signed)
Called Dr. Kenna GilbertMann's office. Patient is scheduled for 05/25/14 at 0930.   Routing to provider for final review.  Will close encounter.

## 2014-05-25 ENCOUNTER — Telehealth: Payer: Self-pay | Admitting: Obstetrics & Gynecology

## 2014-05-25 NOTE — Telephone Encounter (Signed)
Pt had a colonoscopy and endoscopy done. She's not sure what she needs to do to make sure Dr Hyacinth MeekerMiller get those results.

## 2014-05-25 NOTE — Telephone Encounter (Signed)
Spoke with patient. She states she saw Dr. Loreta AveMann this morning and had both an upper GI and colonoscopy. Negative results per patient. She states she requested that Dr. Loreta AveMann send us records. Patient also has had CT scan as well (results in Epic).   Patient states she discussed this with Dr. Loreta AveMann and feels this pain is stress related. She is asking that Dr. Hyacinth MeekerMiller review and start her on a medication for stress. Patient states that her husband is ill and dealing with him and difficulty sleeping sometimes related to her husband's sleep issues. She is asking for something "low dose" and that she doesn't want to be sedated. Patient states she is working to find new PCP and does not want to discuss this with her PCP.   Advised would send message to Dr. Hyacinth MeekerMiller and would return call with response. Patient agreeable.

## 2014-05-27 NOTE — Telephone Encounter (Signed)
Ok to start citalopram 20mg  daily.  May want to follow up in 4-6 weeks to discuss improvement and/or additional needs.  Ok to send in rx for #30/2RF.

## 2014-05-28 NOTE — Telephone Encounter (Signed)
Call to patient, she is not home per female who answered phone. Advised will try back later.

## 2014-05-29 NOTE — Telephone Encounter (Signed)
Message left to return call to Misty Haney at [phone].    

## 2014-06-02 MED ORDER — CITALOPRAM HYDROBROMIDE 20 MG PO TABS: 20.0000 mg | ORAL_TABLET | Freq: Every day | ORAL | Status: DC

## 2014-06-02 NOTE — Telephone Encounter (Signed)
Patient returned call. She is given message from Dr. Hyacinth MeekerMiller. Patient advised of instructions for new start of Celexa. Discussed with patient risks of SSRI,  May cause nausea, GI upset for first week but should subside.  All SSRI's can cause a rare syndrome called serotonin syndrome, it raises your blood pressure and you will feel very abnormal.  It is rare and more likely with certain medication combinations. Advised patient to call with any side effects that are concerning and to call right away if you have thoughts of suicide, agitation; change in balance; confusion; hallucinations; fever; Remy Voiles or abnormal heartbeat; flushing; muscle twitching or stiffness; seizures; shivering or shaking; sweating a lot; very bad diarrhea, upset stomach, or throwing up; or very bad headache.   Patient verbalized understanding of instructions and risks of possible side effects and is scheduled for follow up appointment with Dr. Hyacinth MeekerMiller scheduled for 07/14/14 at 2:30   Routing to provider for final review. Patient agreeable to disposition. Will close encounter.

## 2014-07-14 ENCOUNTER — Ambulatory Visit (INDEPENDENT_AMBULATORY_CARE_PROVIDER_SITE_OTHER): Payer: BLUE CROSS/BLUE SHIELD | Admitting: Obstetrics & Gynecology

## 2014-07-14 VITALS — BP 124/72 | HR 56 | Resp 16 | Wt 145.0 lb

## 2014-07-14 DIAGNOSIS — R5383 Other fatigue: Secondary | ICD-10-CM | POA: Diagnosis not present

## 2014-07-14 DIAGNOSIS — F411 Generalized anxiety disorder: Secondary | ICD-10-CM | POA: Diagnosis not present

## 2014-07-14 LAB — FOLATE: Folate: 17.5 ng/mL

## 2014-07-14 LAB — VITAMIN B12: VITAMIN B 12: 298 pg/mL (ref 211–911)

## 2014-07-14 MED ORDER — FLUOXETINE HCL 20 MG PO CAPS: 20.0000 mg | ORAL_CAPSULE | Freq: Every day | ORAL | Status: DC

## 2014-07-14 NOTE — Progress Notes (Signed)
Patient ID: Misty RafterSylvia T Mao, female   DOB: 02/11/1955, 59 y.o.   MRN: [id number]  59 yo G3P2 MWF here for follow up after starting Citalopram.  This was started in the end of May due to increased anxiety related to husband's chronic lung issues and disability.  Pt was having a lot of abdominal pain that I felt was anxiety related.  Had normal PUS 04/14/14 and then pt was referred for a colonoscopy and endoscopy in early June with Dr. Loreta AveMann.  Results are in EPIC and were fine.  Pt feels like the Citalopram has helped her abdominal pain and she really isn't having this issue at all.  However, since starting the medication, she is having a dull achy headache almost every day.  She isn't taking anything for this.  It is just barely there but completely annoying.  Also, feels so "bla" since starting it and doesn't really care about housework or other things that need to be done.  Feels like she has had increased fatigue with the medication.  Glad abdominal pain is better but would like fatigue to improve.  D/W pt other options.  As has generally done well with the citalopram, will switch to prozac.  Pt aware can just switch from one to the other.  She is just about out of the first prescription so will go ahead and get this filled.    Reports she will be talking to her husband's doctor about something for him at next visit.  She feels if her were on this medication, she may not need to be for long term.  Her issue, she reports, really is his anxiety/depression with recent diagnoses.    Assessment:  Anxiety related to husband's disability a chronic lung disease diagnosis Abdominal pain that is much improved Increased disinterest and fatigue  Plan:  Switch to Prozac 20mg  daily.  Aware ok to just switch. Folate and b 12 today.  Had normal TSH and CBC in June.    ~15 minutes spent with patient >50% of time was in face to face discussion of above.

## 2014-07-15 ENCOUNTER — Encounter: Payer: Self-pay | Admitting: Obstetrics & Gynecology

## 2014-07-15 DIAGNOSIS — F411 Generalized anxiety disorder: Secondary | ICD-10-CM | POA: Insufficient documentation

## 2014-07-17 ENCOUNTER — Telehealth: Payer: Self-pay

## 2014-07-17 NOTE — Telephone Encounter (Signed)
Lmtcb//kn 

## 2014-07-17 NOTE — Telephone Encounter (Signed)
-----   Message from Jerene BearsMary S Miller, MD sent at 07/15/2014  9:44 AM EDT ----- Please inform pt these are normal.  No extra Vit b12 needed.  Pt is going to give update about changing to Prozac in about two weeks.  No extra vitamin supplementation needed.

## 2014-08-24 NOTE — Telephone Encounter (Signed)
Patient notified of all results-see result note//kn 

## 2014-12-04 ENCOUNTER — Ambulatory Visit (INDEPENDENT_AMBULATORY_CARE_PROVIDER_SITE_OTHER): Payer: 59 | Admitting: Obstetrics & Gynecology

## 2014-12-04 ENCOUNTER — Encounter: Payer: Self-pay | Admitting: Obstetrics & Gynecology

## 2014-12-04 VITALS — BP 120/72 | HR 70 | Resp 16 | Ht 59.75 in | Wt 146.0 lb

## 2014-12-04 DIAGNOSIS — Z01419 Encounter for gynecological examination (general) (routine) without abnormal findings: Secondary | ICD-10-CM | POA: Diagnosis not present

## 2014-12-04 DIAGNOSIS — N839 Noninflammatory disorder of ovary, fallopian tube and broad ligament, unspecified: Secondary | ICD-10-CM

## 2014-12-04 DIAGNOSIS — Z0000 Encounter for general adult medical examination without abnormal findings: Secondary | ICD-10-CM | POA: Diagnosis not present

## 2014-12-04 DIAGNOSIS — N838 Other noninflammatory disorders of ovary, fallopian tube and broad ligament: Secondary | ICD-10-CM

## 2014-12-04 LAB — POCT URINALYSIS DIPSTICK
Bilirubin, UA: NEGATIVE
Glucose, UA: NEGATIVE
Ketones, UA: NEGATIVE
Leukocytes, UA: NEGATIVE
Nitrite, UA: NEGATIVE
PH UA: 5
PROTEIN UA: NEGATIVE
RBC UA: NEGATIVE
UROBILINOGEN UA: NEGATIVE

## 2014-12-04 NOTE — Addendum Note (Signed)
Addended by: Jerene BearsMILLER, Brit Carbonell S on: 12/04/2014 12:13 PM   Modules accepted: Orders, SmartSet

## 2014-12-04 NOTE — Progress Notes (Signed)
59 y.o. G3P2 MarriedCaucasianF here for annual exam.  Pt reports stressors with family this year.  Her husband's brother died in September due to small cell lung cancer that was diagnosed and he died in less than six weeks.  Pt's husband has chronic lung disease and is on disability from this.  She's had less stress as all the disability is finalized.  Also, brother-in-law's death has helped to change her husband's outlook.  Pt was on prozac this summer for a short while.  She stopped this and feels "better"     Denies vaginal bleeding.    Patient's last menstrual period was 01/03/2008.          Sexually active: Yes.    The current method of family planning is tubal ligation.    Exercising: No.  exercise Smoker:  no  Health Maintenance: Pap: 11-04-13 neg, HPV HR neg, atrophy History of abnormal Pap:  no MMG: 12-11-13 category b density,birads 1:neg Colonoscopy:  2016 neg.  Follow up five yeard.  Done with Dr. Loreta AveMann.   BMD:   02-11-12 osteopenia[facility] TDaP:  2008 Screening Labs: pcp, Hb today: pcp, Urine today: neg Self breast exam: done occ   reports that she has never smoked. She has never used smokeless tobacco. She reports that she does not drink alcohol or use illicit drugs.  Past Medical History  Diagnosis Date  . Anemia   . Chest pain 5/00    negative cardiac cath  . Fibromyalgia   . Osteopenia   . Hypertension   . Swine flu 1/14  . Elevated cholesterol     Past Surgical History  Procedure Laterality Date  . Appendectomy    . Tubal ligation    . Laparoscopic cholecystectomy    . Neck surgery  8/06    secondary disc degeneration  . Colonoscopy    . Cortisone injection      x 4 in left foot, ? diag with bursitis    Current Outpatient Prescriptions  Medication Sig Dispense Refill  . amLODipine (NORVASC) 5 MG tablet   0  . Coenzyme Q10 (CO Q-10 PO) Take by mouth daily.    Marland Kitchen. dicyclomine (BENTYL) 10 MG capsule   0  . Flaxseed, Linseed, (FLAX SEEDS PO) Take  450 mg by mouth. Two daily    . KRILL OIL PO Take 500 mg by mouth daily.    Marland Kitchen. levothyroxine (SYNTHROID, LEVOTHROID) 50 MCG tablet Take 1 tablet (50 mcg total) by mouth daily. 90 tablet 4  . Multiple Vitamins-Minerals (MULTIVITAMIN PO) Take by mouth daily. Gummy Vites    . simvastatin (ZOCOR) 20 MG tablet Take 20 mg by mouth daily.     No current facility-administered medications for this visit.    Family History  Problem Relation Age of Onset  . Diabetes Mother   . Cancer Mother     ovarian, breast, liver-? primary site  . Irritable bowel syndrome Mother   . Breast cancer Maternal Aunt     and stomach cancer  . Lung cancer Father   . Prostate cancer Paternal Grandfather   . Brain cancer Maternal Grandmother     ROS:  Pertinent items are noted in HPI.  Otherwise, a comprehensive ROS was negative.  Exam:   BP 120/72 mmHg  Pulse 70  Resp 16  Ht 4' 11.75" (1.518 m)  Wt 146 lb (66.225 kg)  BMI 28.74 kg/m2  LMP 01/03/2008  Weight change: -5#  Height: 4' 11.75" (151.8 cm)  Ht Readings from Last 3 Encounters:  12/04/14 4' 11.75" (1.518 m)  04/23/14 5' (1.524 m)  11/04/13 5' (1.524 m)    General appearance: alert, cooperative and appears stated age Head: Normocephalic, without obvious abnormality, atraumatic Neck: no adenopathy, supple, symmetrical, trachea midline and thyroid normal to inspection and palpation Lungs: clear to auscultation bilaterally Breasts: normal appearance, no masses or tenderness Heart: regular rate and rhythm Abdomen: soft, non-tender; bowel sounds normal; no masses,  no organomegaly Extremities: extremities normal, atraumatic, no cyanosis or edema Skin: Skin color, texture, turgor normal. No rashes or lesions Lymph nodes: Cervical, supraclavicular, and axillary nodes normal. No abnormal inguinal nodes palpated Neurologic: Grossly normal   Pelvic: External genitalia:  no lesions              Urethra:  normal appearing urethra with no masses,  tenderness or lesions              Bartholins and Skenes: normal                 Vagina: normal appearing vagina with normal color and discharge, no lesions              Cervix: no lesions              Pap taken: No. Bimanual Exam:  Uterus:  normal size, contour, position, consistency, mobility, non-tender              Adnexa: normal adnexa and no mass, fullness, tenderness               Rectovaginal: Confirms               Anus:  normal sphincter tone, no lesions  Chaperone was present for exam.  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 Ultrasound 08/22/11 was stable. Watching conservatively. Osteopenia Elevated lipids Hypothyroidism Borderline hypertension IBS  P: Mammogram yearly. Pt aware.  She is going to schedule her MMG.  [facility] but not sure where to go.   Labs with PCP pap smear with neg HR HPV 10/13. Normal pap 2015.  No pap today.   Consider repeating PUS this year Plan BMD next year.  Pt doesn't want to do it this year as she is having changes with insurance. return annually or prn

## 2014-12-08 ENCOUNTER — Telehealth: Payer: Self-pay | Admitting: Obstetrics & Gynecology

## 2014-12-08 NOTE — Telephone Encounter (Signed)
Spoke with patient regarding benefit for pelvic ultrasound with Dr Hyacinth MeekerMiller. Patient understands benefit and prefers to wait until January 2017 to schedule. Patient receiving new insurance plan in January and prefers to call office with new info once received and have insurance verify benefits for ultrasound at that time. Note in referral acknowledges this possibility. Routing to provider for review. Deferring referral until beginning of January pending patients updated information.

## 2014-12-08 NOTE — Telephone Encounter (Signed)
Thank you.  Ok to close encounter. 

## 2015-01-22 ENCOUNTER — Telehealth: Payer: Self-pay | Admitting: Obstetrics & Gynecology

## 2015-01-22 NOTE — Telephone Encounter (Signed)
Update to Dr. Hyacinth Meeker.  Patient scheduled for Pelvic ultrasound  02/04/15 with Dr. Hyacinth Meeker.  Will close encounter.

## 2015-01-22 NOTE — Telephone Encounter (Signed)
Patient wanted to advised she was evaluated by her PCP, [facility],  on 01/21/15,  a Bone Density Test was completed at that time, and the report will be forward to Dr. Hyacinth Meeker

## 2015-01-22 NOTE — Telephone Encounter (Signed)
Spoke with pt regarding benefit for ultrasound. Patient understood and agreeable. Patient ready to schedule. Patient scheduled 02/04/15 with Dr Miller. Pt aware of arrival date and time. Pt aware of 72 hours cancellation policy with $100 fee. No further questions. Ok to close °

## 2015-02-04 ENCOUNTER — Ambulatory Visit (INDEPENDENT_AMBULATORY_CARE_PROVIDER_SITE_OTHER): Payer: BLUE CROSS/BLUE SHIELD | Admitting: Obstetrics & Gynecology

## 2015-02-04 ENCOUNTER — Ambulatory Visit (INDEPENDENT_AMBULATORY_CARE_PROVIDER_SITE_OTHER): Payer: BLUE CROSS/BLUE SHIELD

## 2015-02-04 VITALS — BP 136/82 | HR 72 | Resp 14 | Ht 59.75 in | Wt 149.0 lb

## 2015-02-04 DIAGNOSIS — N839 Noninflammatory disorder of ovary, fallopian tube and broad ligament, unspecified: Secondary | ICD-10-CM

## 2015-02-04 DIAGNOSIS — N838 Other noninflammatory disorders of ovary, fallopian tube and broad ligament: Secondary | ICD-10-CM

## 2015-02-04 DIAGNOSIS — N949 Unspecified condition associated with female genital organs and menstrual cycle: Secondary | ICD-10-CM | POA: Diagnosis not present

## 2015-02-04 DIAGNOSIS — N9489 Other specified conditions associated with female genital organs and menstrual cycle: Secondary | ICD-10-CM

## 2015-02-04 NOTE — Progress Notes (Signed)
60 y.o. G2P2 Married Caucasian female here for pelvic ultrasound due to solid mass on left ovary that was not seen last year on PUS.  Pt and I have agreed if it is not seen today, then we will not repeat PUS unless she has some new issues.    Another reason PUS planned is due to increased GI issues/pain she's had this past year.  Her husband is now on disability due to chronic lung disease from work exposure.  This is really caused increased stressors for her.  She is learning to adjust and cope and figure out ways to control her stress.  She has had additional GI evaluation as well.  Colonoscopy 05/25/14 was good.  Patient's last menstrual period was 01/03/2008.  Findings:  UTERUS:  6.1  3.4 x 2.6cm with two small uterine calcifications noted, largest 3mm EMS: 1.53mm ADNEXA: Left ovary: 3.3 x 1.7 x 1.4cm.  No solid component noted again today       Right ovary: 2.6 x 1.5 x 1.1cm CUL DE SAC:  No free fluid  Discussion:  Images from today as well as previous examinations reviewed together.  As we now have two visits that the solid component could not be seen, do not feel pt needs additional PUS examination unless additional symptoms occur.  Pt in agreement with this and comfortable about stopping PUS.  She knows if symptoms change, she is welcome to call.  Assessment:  Left solid adnexal mass that has resolved on PUS (or may now just be too small to see)  Plan:  Return for AEX, already scheduled.  ~15 minutes spent with patient >50% of time was in face to face discussion of above.

## 2015-02-07 ENCOUNTER — Encounter: Payer: Self-pay | Admitting: Obstetrics & Gynecology

## 2015-10-05 ENCOUNTER — Other Ambulatory Visit: Payer: Self-pay | Admitting: Obstetrics & Gynecology

## 2015-10-05 DIAGNOSIS — Z1231 Encounter for screening mammogram for malignant neoplasm of breast: Secondary | ICD-10-CM

## 2015-10-19 ENCOUNTER — Ambulatory Visit
Admission: RE | Admit: 2015-10-19 | Discharge: 2015-10-19 | Disposition: A | Discharge status: Home / Self Care | Payer: BLUE CROSS/BLUE SHIELD | Source: Ambulatory Visit | Attending: Obstetrics & Gynecology | Admitting: Obstetrics & Gynecology

## 2015-10-19 DIAGNOSIS — Z1231 Encounter for screening mammogram for malignant neoplasm of breast: Secondary | ICD-10-CM

## 2015-11-03 HISTORY — PX: FOOT SURGERY: SHX648

## 2015-11-22 IMAGING — MG MM DIGITAL SCREENING BILAT
4 series · 4 of 4 positions shown · non-contrast
Comparison: Previous exam(s).

CLINICAL DATA: Screening.

EXAM:
DIGITAL SCREENING BILATERAL MAMMOGRAM WITH CAD

[R CC]
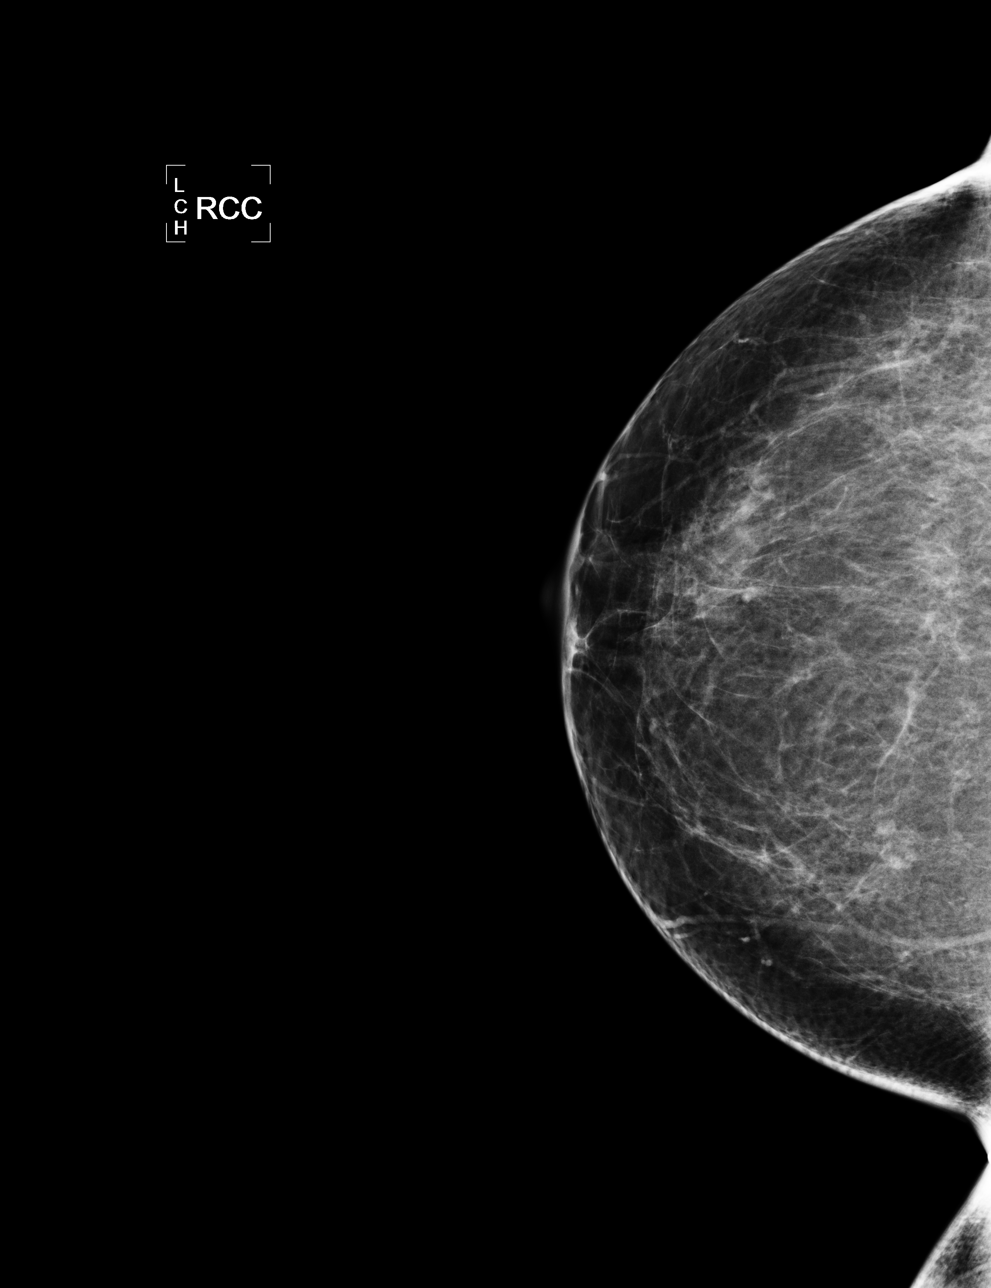

[R MLO]
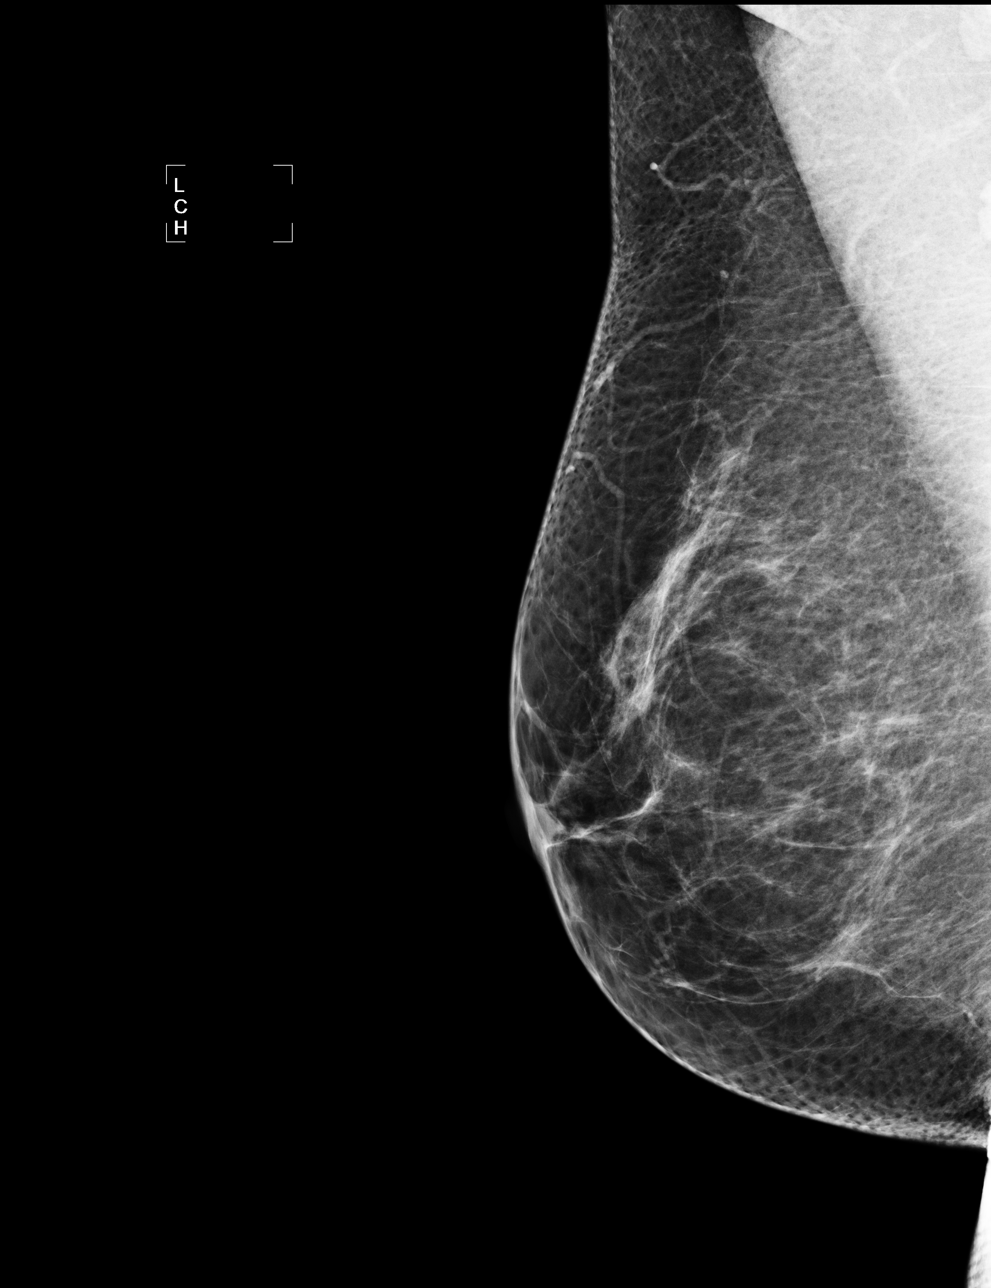

[L CC]
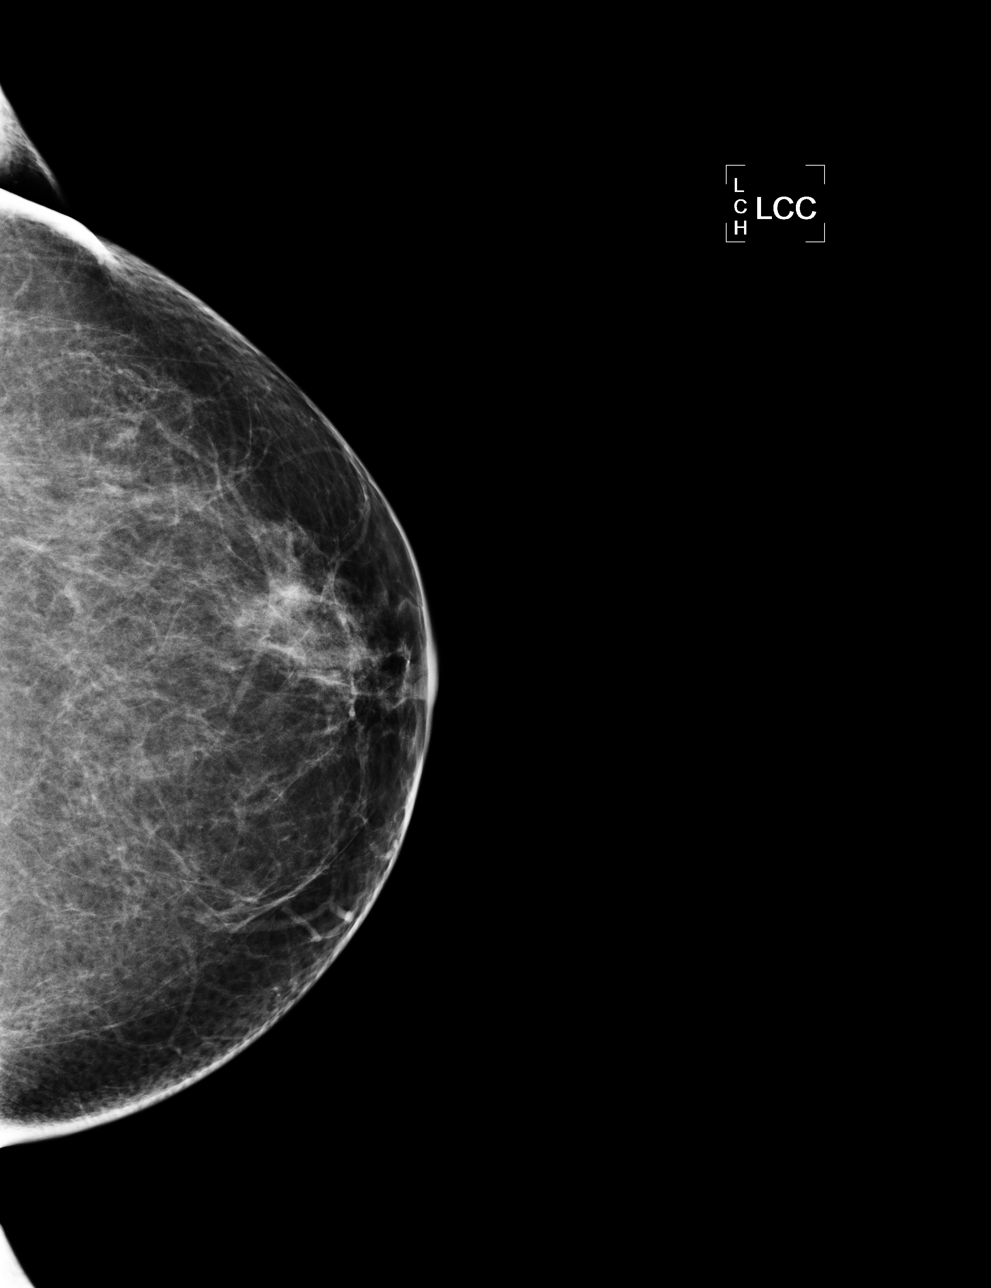

[L MLO]
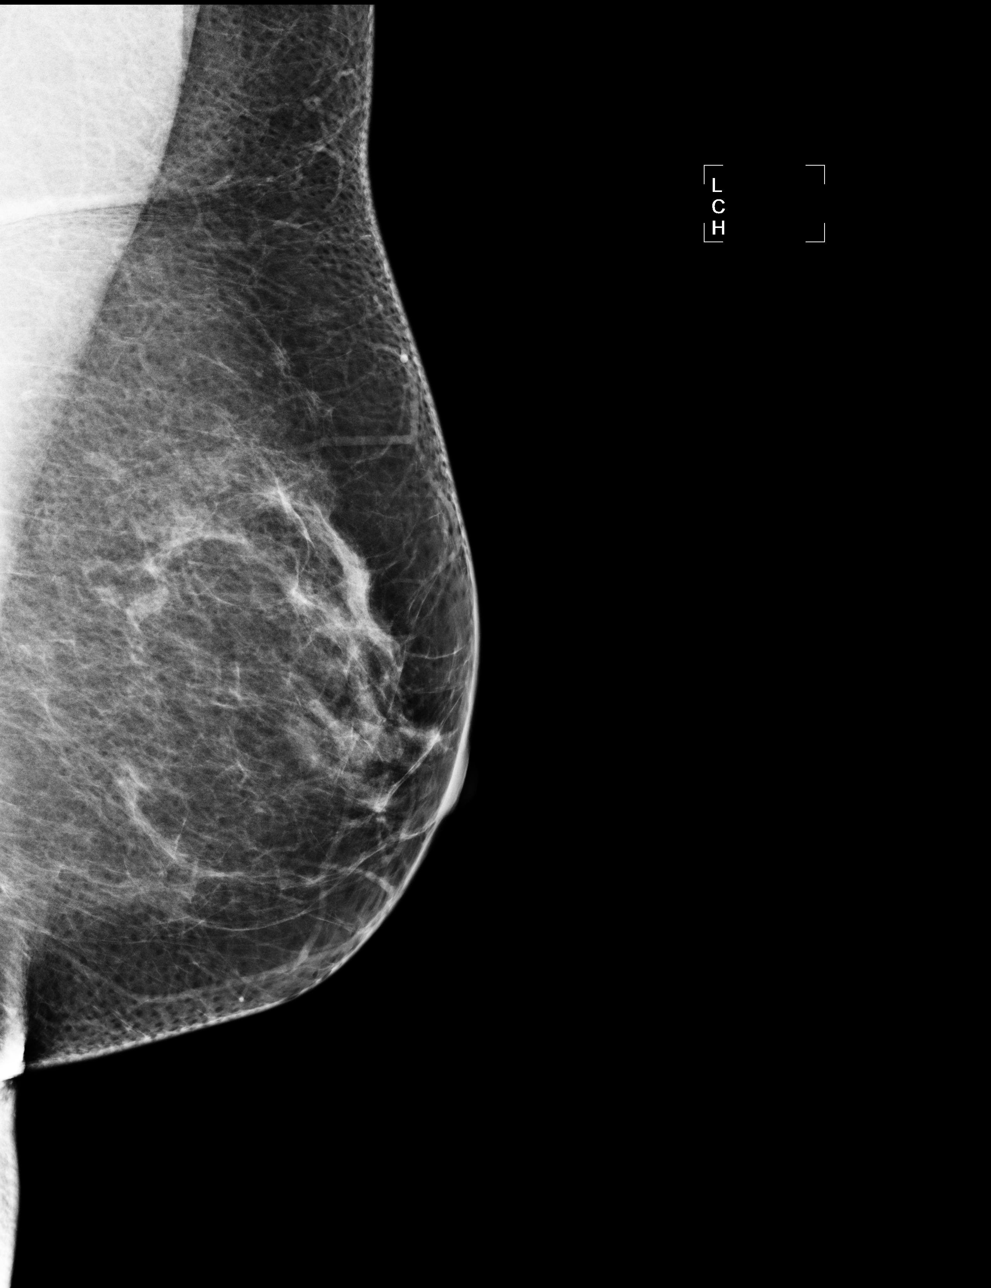

[4 of 4 positions shown; findings below may reference images not displayed]

ACR Breast Density Category b: There are scattered areas of
fibroglandular density.
FINDINGS: There are no findings suspicious for malignancy. Images were
processed with CAD.
IMPRESSION: No mammographic evidence of malignancy. A result letter of this
screening mammogram will be mailed directly to the patient.

RECOMMENDATION:
Screening mammogram in one year. (Code:AS-G-LCT)

BI-RADS CATEGORY  1: Negative.

## 2016-01-11 ENCOUNTER — Encounter: Payer: Self-pay | Admitting: Obstetrics & Gynecology

## 2016-01-11 ENCOUNTER — Ambulatory Visit (INDEPENDENT_AMBULATORY_CARE_PROVIDER_SITE_OTHER): Payer: BLUE CROSS/BLUE SHIELD | Admitting: Obstetrics & Gynecology

## 2016-01-11 VITALS — BP 122/78 | HR 80 | Resp 16 | Ht 59.5 in | Wt 153.0 lb

## 2016-01-11 DIAGNOSIS — E785 Hyperlipidemia, unspecified: Secondary | ICD-10-CM

## 2016-01-11 DIAGNOSIS — I10 Essential (primary) hypertension: Secondary | ICD-10-CM

## 2016-01-11 DIAGNOSIS — Z124 Encounter for screening for malignant neoplasm of cervix: Secondary | ICD-10-CM

## 2016-01-11 DIAGNOSIS — Z205 Contact with and (suspected) exposure to viral hepatitis: Secondary | ICD-10-CM

## 2016-01-11 DIAGNOSIS — Z0000 Encounter for general adult medical examination without abnormal findings: Secondary | ICD-10-CM | POA: Diagnosis not present

## 2016-01-11 DIAGNOSIS — Z01419 Encounter for gynecological examination (general) (routine) without abnormal findings: Secondary | ICD-10-CM | POA: Diagnosis not present

## 2016-01-11 DIAGNOSIS — Z23 Encounter for immunization: Secondary | ICD-10-CM

## 2016-01-11 LAB — POCT URINALYSIS DIPSTICK
Bilirubin, UA: NEGATIVE
Blood, UA: NEGATIVE
Glucose, UA: NEGATIVE
KETONES UA: NEGATIVE
Leukocytes, UA: NEGATIVE
Nitrite, UA: NEGATIVE
PROTEIN UA: NEGATIVE
Urobilinogen, UA: NEGATIVE
pH, UA: 5

## 2016-01-11 LAB — COMPREHENSIVE METABOLIC PANEL
ALT: 17 U/L (ref 6–29)
AST: 21 U/L (ref 10–35)
Albumin: 4.3 g/dL (ref 3.6–5.1)
Alkaline Phosphatase: 48 U/L (ref 33–130)
BUN: 12 mg/dL (ref 7–25)
CALCIUM: 9.7 mg/dL (ref 8.6–10.4)
CO2: 28 mmol/L (ref 20–31)
CREATININE: 0.67 mg/dL (ref 0.50–0.99)
Chloride: 102 mmol/L (ref 98–110)
GLUCOSE: 86 mg/dL (ref 65–99)
Potassium: 3.5 mmol/L (ref 3.5–5.3)
SODIUM: 141 mmol/L (ref 135–146)
Total Bilirubin: 0.6 mg/dL (ref 0.2–1.2)
Total Protein: 7.7 g/dL (ref 6.1–8.1)

## 2016-01-11 LAB — LIPID PANEL
Cholesterol: 173 mg/dL (ref <200)
HDL: 57 mg/dL (ref >50)
LDL CALC: 86 mg/dL (ref <100)
Total CHOL/HDL Ratio: 3 Ratio (ref <5.0)
Triglycerides: 148 mg/dL (ref <150)
VLDL: 30 mg/dL (ref <30)

## 2016-01-11 LAB — CBC
HCT: 38.9 % (ref 35.0–45.0)
Hemoglobin: 13 g/dL (ref 11.7–15.5)
MCH: 31.3 pg (ref 27.0–33.0)
MCHC: 33.4 g/dL (ref 32.0–36.0)
MCV: 93.5 fL (ref 80.0–100.0)
MPV: 9.4 fL (ref 7.5–12.5)
PLATELETS: 229 10*3/uL (ref 140–400)
RBC: 4.16 MIL/uL (ref 3.80–5.10)
RDW: 13.7 % (ref 11.0–15.0)
WBC: 6.1 10*3/uL (ref 3.8–10.8)

## 2016-01-11 LAB — TSH: TSH: 1.87 m[IU]/L

## 2016-01-11 NOTE — Progress Notes (Signed)
61 y.o. G2P2 MarriedCaucasianF here for annual exam.  Reports she is doing well.  Husband has been having some issues with his pulmonary fibrosis.  He is on oxygen almost all of the time.    Denies vaginal bleeding.     Patient's last menstrual period was 01/03/2008.          Sexually active: No.  The current method of family planning is post menopausal status.    Exercising: No.  The patient does not participate in regular exercise at present. Smoker:  no  Health Maintenance: Pap:  11-04-13 neg, HPV HR neg, atrophy History of abnormal Pap:  no MMG:  10/19/15 BIRADS 1 negative Colonoscopy:  2016 neg.  Follow up five yeard.  Done with Dr. Loreta Ave. BMD:   03/08/15 - Osteopenia TDaP:  2008 Hep C testing: will do today Screening Labs: PCP, Hb today: PCP, Urine today: negative   reports that she has never smoked. She has never used smokeless tobacco. She reports that she does not drink alcohol or use drugs.  Past Medical History:  Diagnosis Date  . Anemia   . Chest pain 5/00   negative cardiac cath  . Elevated cholesterol   . Fibromyalgia   . Hypertension   . Osteopenia   . Swine flu 1/14    Past Surgical History:  Procedure Laterality Date  . APPENDECTOMY    . COLONOSCOPY    . cortisone injection     x 4 in left foot, ? diag with bursitis  . FOOT SURGERY Right 11/2015  . LAPAROSCOPIC CHOLECYSTECTOMY    . NECK SURGERY  8/06   secondary disc degeneration  . TUBAL LIGATION      Current Outpatient Prescriptions  Medication Sig Dispense Refill  . amLODipine (NORVASC) 5 MG tablet   0  . Coenzyme Q10 (CO Q-10 PO) Take by mouth daily.    Marland Kitchen dicyclomine (BENTYL) 10 MG capsule   0  . Flaxseed, Linseed, (FLAX SEEDS PO) Take 450 mg by mouth. Two daily    . hydrochlorothiazide (HYDRODIURIL) 25 MG tablet Take 25 mg by mouth daily.    Marland Kitchen KRILL OIL PO Take 500 mg by mouth daily.    Marland Kitchen levothyroxine (SYNTHROID, LEVOTHROID) 50 MCG tablet Take 1 tablet (50 mcg total) by mouth daily. 90 tablet  4  . meloxicam (MOBIC) 15 MG tablet Take 15 mg by mouth daily.    . Multiple Vitamins-Minerals (MULTIVITAMIN PO) Take by mouth daily. Gummy Vites    . simvastatin (ZOCOR) 20 MG tablet Take 20 mg by mouth daily.     No current facility-administered medications for this visit.     Family History  Problem Relation Age of Onset  . Diabetes Mother   . Cancer Mother     ovarian, breast, liver-? primary site  . Irritable bowel syndrome Mother   . Breast cancer Maternal Aunt     and stomach cancer  . Lung cancer Father   . Prostate cancer Paternal Grandfather   . Brain cancer Maternal Grandmother     ROS:  Pertinent items are noted in HPI.  Otherwise, a comprehensive ROS was negative.  Exam:   BP 122/78 (BP Location: Right Arm, Patient Position: Sitting, Cuff Size: Normal)   Pulse 80   Resp 16   Ht 4' 11.5" (1.511 m)   Wt 153 lb (69.4 kg)   LMP 01/03/2008   BMI 30.39 kg/m   Weight change: +7#  Height: 4' 11.5" (151.1 cm)  Ht Readings from  Last 3 Encounters:  01/11/16 4' 11.5" (1.511 m)  02/04/15 4' 11.75" (1.518 m)  12/04/14 4' 11.75" (1.518 m)   General appearance: alert, cooperative and appears stated age Head: Normocephalic, without obvious abnormality, atraumatic Neck: no adenopathy, supple, symmetrical, trachea midline and thyroid normal to inspection and palpation Lungs: clear to auscultation bilaterally Breasts: normal appearance, no masses or tenderness Heart: regular rate and rhythm Abdomen: soft, non-tender; bowel sounds normal; no masses,  no organomegaly Extremities: extremities normal, atraumatic, no cyanosis or edema Skin: Skin color, texture, turgor normal. No rashes or lesions Lymph nodes: Cervical, supraclavicular, and axillary nodes normal. No abnormal inguinal nodes palpated Neurologic: Grossly normal   Pelvic: External genitalia:  no lesions              Urethra:  normal appearing urethra with no masses, tenderness or lesions              Bartholins  and Skenes: normal                 Vagina: normal appearing vagina with normal color and discharge, no lesions              Cervix: no lesions              Pap taken: Yes.   Bimanual Exam:  Uterus:  normal size, contour, position, consistency, mobility, non-tender              Adnexa: normal adnexa and no mass, fullness, tenderness               Rectovaginal: Confirms               Anus:  normal sphincter tone, no lesions  Chaperone was present for exam.  A:   Well Woman with normal exam H/o 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  PUS 2/17 showed no solid component Osteopenia Elevated lipids Hypothyroidism Hypertension IBS  P: Mammogram yearly. Guidelines. Pap and HR HPV obtained today.   CBC, CMP, TSH, Vit D, lipids Hep C antibody obtained today Return annually or prn

## 2016-01-12 LAB — VITAMIN D 25 HYDROXY (VIT D DEFICIENCY, FRACTURES): Vit D, 25-Hydroxy: 28 ng/mL — ABNORMAL LOW (ref 30–100)

## 2016-01-12 LAB — HEPATITIS C ANTIBODY: HCV Ab: NEGATIVE

## 2016-01-13 LAB — IPS PAP TEST WITH HPV

## 2016-02-04 ENCOUNTER — Telehealth: Payer: Self-pay | Admitting: Obstetrics & Gynecology

## 2016-02-04 NOTE — Telephone Encounter (Signed)
Patient has a rash coming from her lower back up her spine.

## 2016-02-04 NOTE — Telephone Encounter (Signed)
Spoke with patient. Patient states she has little painful, itchy bumps on lower back running up her spine. Patient states they appeared 2-3 days ago. Patient states she had husband take a picture with phone so that she could look at them closer. Patient states they look like little red blisters. Patient states she applied rubbing alcohol for itch relief. Patient denies any gyn or urinary complaints. Recommended patient to f/u with pcp. Patient states she would prefer not to d/t it being flu season. Advised patient she could request mask at check-in for barrier and advised handwashing. Patient states she will [facility]. Advised patient would review with Dr. Hyacinth MeekerMiller and return call if she has any additional recommendations. Patient verbalizes understanding and is agreeable.    Routing to provider for final review. Patient is agreeable to disposition. Will close encounter.

## 2016-03-24 ENCOUNTER — Ambulatory Visit: Payer: 59 | Admitting: Obstetrics & Gynecology

## 2016-06-14 ENCOUNTER — Telehealth: Payer: Self-pay | Admitting: Obstetrics & Gynecology

## 2016-06-14 NOTE — Telephone Encounter (Signed)
Patient called requesting an appointment with Dr. Hyacinth MeekerMiller for abdominal and pelvic pain.  Last seen: 01/11/16

## 2016-06-14 NOTE — Telephone Encounter (Signed)
I agree with an appointment for evaluation in the office. If she develops increasing pain, nausea and vomiting, she will need to go to the emergency department.  I see she has already had an appendectomy and a cholecystectomy.   Cc- Dr. Hyacinth MeekerMiller

## 2016-06-14 NOTE — Telephone Encounter (Signed)
Spoke with patient. Patient reports for several weeks she has been experiencing RLQ pain almost to groin area to lower back. Reports pain as constant dull, sharp at times. Patient reports urinary incontinence at times when walking, frequency & nausea, denies fever or vaginal bleeding. Patient states she has history of fibroids but they caused leg pain when problematic before. Recommended OV for further evaluation, advised patient Dr. Hyacinth MeekerMiller is out of the office today, can schedule with covering provider, patient declined. Patient scheduled for OV with Dr. Hyacinth MeekerMiller on 06/15/16 at 1:15pm. Advised patient would review with covering provider and return call with any additional recommendations, patient verbalizes understanding and is agreeable. Last OV 01/11/16.   Dr. Edward JollySilva, any additional recommendations?  Cc: Dr. Hyacinth MeekerMiller

## 2016-06-14 NOTE — Telephone Encounter (Signed)
Left detailed message, ok per current dpr. Advised as seen below per Dr. Edward JollySilva. Advised to return call to office for any additional questions/concerns.  Routing to provider for final review. Patient is agreeable to disposition. Will close encounter.  Cc: Dr. Hyacinth MeekerMiller

## 2016-06-15 ENCOUNTER — Ambulatory Visit (INDEPENDENT_AMBULATORY_CARE_PROVIDER_SITE_OTHER): Payer: BLUE CROSS/BLUE SHIELD | Admitting: Obstetrics & Gynecology

## 2016-06-15 ENCOUNTER — Encounter: Payer: Self-pay | Admitting: Obstetrics & Gynecology

## 2016-06-15 ENCOUNTER — Ambulatory Visit (INDEPENDENT_AMBULATORY_CARE_PROVIDER_SITE_OTHER): Payer: BLUE CROSS/BLUE SHIELD

## 2016-06-15 VITALS — BP 100/60 | HR 74 | Temp 98.0°F | Resp 16 | Ht 59.5 in | Wt 150.0 lb

## 2016-06-15 DIAGNOSIS — M545 Low back pain, unspecified: Secondary | ICD-10-CM

## 2016-06-15 DIAGNOSIS — R1031 Right lower quadrant pain: Secondary | ICD-10-CM

## 2016-06-15 DIAGNOSIS — R3915 Urgency of urination: Secondary | ICD-10-CM

## 2016-06-15 DIAGNOSIS — M5441 Lumbago with sciatica, right side: Secondary | ICD-10-CM | POA: Diagnosis not present

## 2016-06-15 DIAGNOSIS — R102 Pelvic and perineal pain: Secondary | ICD-10-CM

## 2016-06-15 LAB — POCT URINALYSIS DIPSTICK
BILIRUBIN UA: NEGATIVE
Blood, UA: NEGATIVE
GLUCOSE UA: NEGATIVE
KETONES UA: NEGATIVE
NITRITE UA: NEGATIVE
Protein, UA: NEGATIVE
Urobilinogen, UA: 0.2 E.U./dL
pH, UA: 5 (ref 5.0–8.0)

## 2016-06-15 NOTE — Progress Notes (Signed)
GYNECOLOGY  VISIT   HPI: 61 y.o. G2P2 Married Caucasian female here for complaint of right lower back pain that is radiating into her pelvis or right sided pelvic pain that is radiating into her back.  She cannot tell the difference.  Reports this has been going on for about a month.  She's not sure what started the pain although there are some associated GI symptoms.  Feels that the pain is worse after eating and there is some occasional nausea with the pain.  She's thrown up once but that was several weeks ago and she's not sure it is related.  She denies fever.  She does have some associated anorexia because it feels better when she doesn't eat and her stomach is empty.    Pt had normal colonoscopy with Dr. Loreta AveMann in 2016 and this was negative.       Had shingles in January.  Was treated with Valtrex.  Then in April, she had Bell's Palsy in April.  This was severe and she went to the [place] in [facility] because she was concerned about having a stroke.  CT and MRI were done.  Reviewed today.  No significant findings were noted.  She was treated with medications--names she is not sure about today.  It took about a month but symptoms have fully resolved.  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  This was "years ago" by a Development worker, international aidgeneral surgeon who was in [place].  She does have some scars on her lower back where these were removed.  Reports she felt the pain she is experiencing was similar to when these were removed but this was "years ago".  I have no pathology.  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  She does have some worsening pain with certain movements and with lifting as well.  She seems to have pain across her buttocks on the right side when this occurs as well.  Denies vaginal bleeding.    With ongoing symptoms, pt called her PCP and she could not be seen until the very end of the month.  Was frustrated with this  and called for earlier appt with my office.  GYNECOLOGIC HISTORY: Patient's last menstrual period was 01/03/2008. Contraception: post menopausal  Menopausal hormone therapy: none  Patient Active Problem List   Diagnosis Date Noted  . Essential hypertension 01/11/2016  . Elevated lipids 01/11/2016  . Anxiety state 07/15/2014  . Abdominal pain 04/23/2014  . Bloating 04/23/2014    Past Medical History:  Diagnosis Date  . Anemia   . Chest pain 5/00   negative cardiac cath  . Elevated cholesterol   . Fibromyalgia   . Hypertension   . Influenza 01/2012  . Osteopenia     Past Surgical History:  Procedure Laterality Date  . APPENDECTOMY    . COLONOSCOPY    . cortisone injection     x 4 in left foot, ? diag with bursitis  . FOOT SURGERY Right 11/2015  . LAPAROSCOPIC CHOLECYSTECTOMY    . NECK SURGERY  8/06   secondary disc degeneration  . TUBAL LIGATION      MEDS:  Reviewed in EPIC and UTD  ALLERGIES: Benadryl [diphenhydramine hcl]  Family History  Problem Relation Age of Onset  . Diabetes Mother   . Cancer Mother        ovarian, breast, liver-? primary site  . Irritable bowel syndrome Mother   .  Breast cancer Maternal Aunt        and stomach cancer  . Lung cancer Father   . Prostate cancer Paternal Grandfather   . Brain cancer Maternal Grandmother     SH:  Married, non smoker  Review of Systems  Gastrointestinal: Positive for abdominal pain, nausea and vomiting.  Genitourinary: Positive for frequency and urgency.  All other systems reviewed and are negative.   PHYSICAL EXAMINATION:    BP 100/60 (BP Location: Right Arm, Patient Position: Sitting, Cuff Size: Normal)   Pulse 74   Temp 98 F (36.7 C) (Oral)   Resp 16   Ht 4' 11.5" (1.511 m)   Wt 150 lb (68 kg)   LMP 01/03/2008   BMI 29.79 kg/m     General appearance: alert, cooperative and appears stated age Neck: no adenopathy, supple, symmetrical, trachea midline and thyroid normal to inspection and  palpation CV:  Regular rate and rhythm Lungs:  clear to auscultation, no wheezes, rales or rhonchi, symmetric air entry Abdomen: soft, non-tender; bowel sounds normal; no masses,  no organomegaly  Pelvic: External genitalia:  no lesions              Urethra:  normal appearing urethra with no masses, tenderness or lesions              Bartholins and Skenes: normal                 Vagina: normal appearing vagina with normal color and discharge, no lesions              Cervix: no lesions              Bimanual Exam:  Uterus:  normal size, contour, position, consistency, mobility, non-tender              Adnexa: no mass, fullness, tenderness              Rectovaginal: Yes.  .  Confirms.              Anus:  normal sphincter tone, no lesions  Chaperone was present for exam.  PUS recommended.  This could be done in office today. Uterus:  5.7 x 3.9 x 2.4cm. Endometrium:  1.62mm Left ovary:  2.0 x 1.2 x 1.0cm Right ovary:  2.1 x 1.5 x 1.3cm Cul de sac:  Negative  Discussion:  Findings reviewed.  Completely normal pelvic exam as well as ultrasound noted today.  Pt's symptoms do have some GI component to them but she had a completley negative colonoscopy with Dr. Loreta Ave in 2016.  Am not convinced she need to see GI again.  With the unusual hx of the "masses" being removed from her lower back, feel otho may be the next place for evaluation.  Dr. Loreta Ave may need to be involved again but referral to ortho will be made.  Have advised pt to keep PCP appt, even if it is 2 1/2 weeks away from now.  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  Will refer to ortho.  Pt is advised to keep PCPs appt as well.  Pt voices clear understanding.   ~Lenghty visit with pt today.  ~40 minutes spent with patient >50% of time was in  face to face discussion of  above.

## 2016-06-15 NOTE — Progress Notes (Signed)
Documentation for exam and ultrasound findings completed on note from prior in the day.

## 2016-06-18 ENCOUNTER — Encounter: Payer: Self-pay | Admitting: Obstetrics & Gynecology

## 2016-10-30 ENCOUNTER — Other Ambulatory Visit: Payer: Self-pay | Admitting: Obstetrics & Gynecology

## 2016-10-30 DIAGNOSIS — Z1231 Encounter for screening mammogram for malignant neoplasm of breast: Secondary | ICD-10-CM

## 2016-11-22 ENCOUNTER — Ambulatory Visit
Admission: RE | Admit: 2016-11-22 | Discharge: 2016-11-22 | Disposition: A | Discharge status: Home / Self Care | Payer: BLUE CROSS/BLUE SHIELD | Source: Ambulatory Visit | Attending: Obstetrics & Gynecology | Admitting: Obstetrics & Gynecology

## 2016-11-22 DIAGNOSIS — Z1231 Encounter for screening mammogram for malignant neoplasm of breast: Secondary | ICD-10-CM

## 2017-04-10 ENCOUNTER — Ambulatory Visit: Payer: BLUE CROSS/BLUE SHIELD | Admitting: Obstetrics & Gynecology

## 2017-07-02 ENCOUNTER — Encounter: Payer: Self-pay | Admitting: Obstetrics & Gynecology

## 2017-07-02 ENCOUNTER — Other Ambulatory Visit: Payer: Self-pay

## 2017-07-02 ENCOUNTER — Ambulatory Visit: Payer: BLUE CROSS/BLUE SHIELD | Admitting: Obstetrics & Gynecology

## 2017-07-02 VITALS — BP 120/80 | HR 70 | Resp 16 | Ht 59.5 in | Wt 150.0 lb

## 2017-07-02 DIAGNOSIS — Z01419 Encounter for gynecological examination (general) (routine) without abnormal findings: Secondary | ICD-10-CM

## 2017-07-02 DIAGNOSIS — Z0000 Encounter for general adult medical examination without abnormal findings: Secondary | ICD-10-CM | POA: Diagnosis not present

## 2017-07-02 MED ORDER — ESCITALOPRAM OXALATE 10 MG PO TABS: 10.0000 mg | ORAL_TABLET | Freq: Every day | ORAL | 1 refills | Status: DC

## 2017-07-02 MED ORDER — SIMVASTATIN 20 MG PO TABS: 20.0000 mg | ORAL_TABLET | Freq: Every day | ORAL | 4 refills | Status: DC

## 2017-07-02 MED ORDER — HYDROCHLOROTHIAZIDE 25 MG PO TABS: 25.0000 mg | ORAL_TABLET | Freq: Every day | ORAL | 4 refills | Status: DC

## 2017-07-02 MED ORDER — FUROSEMIDE 20 MG PO TABS: 20.0000 mg | ORAL_TABLET | ORAL | 2 refills | Status: DC | PRN

## 2017-07-02 MED ORDER — LEVOTHYROXINE SODIUM 50 MCG PO TABS: 50.0000 ug | ORAL_TABLET | Freq: Every day | ORAL | 4 refills | Status: DC

## 2017-07-02 NOTE — Progress Notes (Signed)
62 y.o. G2P2 MarriedCaucasianF here for annual exam.  Having continues stressors with husband.    Husband was placed on lung transplant list December 12th.  He started having worse issues in January and he received his transplant January 13th.  He was released in March.  He was readmitted in May 1st to Jun 6th due to groin hematoma.    He's on renal dialysis three days a week.   She is doing all the driving for this.    Had shingles this past year and a Bell's palsy.  Just had such a stressful year for her.    Needs RF for medications.  Her PCP won't write these until she comes back for follow-up but she desires to see someone new.    Patient's last menstrual period was 01/03/2008.          Sexually active: No.  The current method of family planning is post menopausal status, BTL.    Exercising: Yes.    gardening Smoker:  no  Health Maintenance: Pap:  11-04-13 neg HPV HR neg, 01-11-16 neg HPV HR neg History of abnormal Pap:  no  MMG:  11-22-16 category b density birads 1:neg Colonoscopy:  2016 neg f/u 2564yrs BMD:   2017 osteopenia TDaP:  2018 Pneumonia vaccine(s):  Not done Shingrix:   Not done Hep C testing: neg 2018 Screening Labs: 2018   reports that she has never smoked. She has never used smokeless tobacco. She reports that she does not drink alcohol or use drugs.  Past Medical History:  Diagnosis Date  . Anemia   . Chest pain 5/00   negative cardiac cath  . Elevated cholesterol   . Fibromyalgia   . Hypertension   . Influenza 01/2012  . Osteopenia     Past Surgical History:  Procedure Laterality Date  . APPENDECTOMY    . COLONOSCOPY    . FOOT SURGERY Right 11/2015  . LAPAROSCOPIC CHOLECYSTECTOMY    . NECK SURGERY  8/06   secondary disc degeneration  . TUBAL LIGATION      Current Outpatient Medications  Medication Sig Dispense Refill  . amLODipine (NORVASC) 5 MG tablet Take 5 mg by mouth daily.   0  . cholecalciferol (VITAMIN D) 1000 units tablet Take 1,000  Units by mouth daily.    Marland Kitchen. dicyclomine (BENTYL) 10 MG capsule   0  . hydrochlorothiazide (HYDRODIURIL) 25 MG tablet Take 25 mg by mouth daily.    Marland Kitchen. levothyroxine (SYNTHROID, LEVOTHROID) 50 MCG tablet Take 1 tablet (50 mcg total) by mouth daily. 90 tablet 4  . simvastatin (ZOCOR) 20 MG tablet Take 20 mg by mouth daily.     No current facility-administered medications for this visit.     Family History  Problem Relation Age of Onset  . Diabetes Mother   . Cancer Mother        ovarian, breast, liver-? primary site  . Irritable bowel syndrome Mother   . Breast cancer Maternal Aunt        and stomach cancer  . Lung cancer Father   . Prostate cancer Paternal Grandfather   . Brain cancer Maternal Grandmother     Review of Systems  Constitutional: Negative.   HENT: Negative.   Eyes: Negative.   Respiratory: Negative.   Cardiovascular: Negative.   Genitourinary:       Loss of urine with cough or sneeze  Musculoskeletal: Negative.   Skin: Negative.   Neurological: Negative.   Endo/Heme/Allergies: Negative.   Psychiatric/Behavioral:  Positive for depression.       Excessive crying    Exam:   Vitals:   07/02/17 1457  BP: 120/80  Pulse: 70  Resp: 16    General appearance: alert, cooperative and appears stated age Head: Normocephalic, without obvious abnormality, atraumatic Neck: no adenopathy, supple, symmetrical, trachea midline and thyroid normal to inspection and palpation Lungs: clear to auscultation bilaterally Breasts: normal appearance, no masses or tenderness Heart: regular rate and rhythm Abdomen: soft, non-tender; bowel sounds normal; no masses,  no organomegaly Extremities: extremities normal, atraumatic, no cyanosis or edema Skin: Skin color, texture, turgor normal. No rashes or lesions Lymph nodes: Cervical, supraclavicular, and axillary nodes normal. No abnormal inguinal nodes palpated Neurologic: Grossly normal   Pelvic: External genitalia:  no lesions               Urethra:  normal appearing urethra with no masses, tenderness or lesions              Bartholins and Skenes: normal                 Vagina: normal appearing vagina with normal color and discharge, no lesions              Cervix: no lesions              Pap taken: No. Bimanual Exam:  Uterus:  normal size, contour, position, consistency, mobility, non-tender              Adnexa: normal adnexa and no mass, fullness, tenderness               Rectovaginal: Confirms               Anus:  normal sphincter tone, no lesions  Chaperone was present for exam.  A:  Well Woman with normal exam PMP, no HRT Osteopenia Elevated lipids Hypothyroidism Hypertension IBS Anxiety/depression  P:   Mammogram guidelines reviewed.   pap smear with neg HR HPV 2018 CMP, TSH and Vit D obtained today RF for lasix, Levothyroxine, simvastatin, and HCTZ.  Will help pt with establishing care with new PCP Trial of lexapro 10mg  daily.  #30/1RF.  She will give me an update in a few weeks.  Knows to call with any concerns Return annually or prn

## 2017-07-03 LAB — COMPREHENSIVE METABOLIC PANEL
ALBUMIN: 4.8 g/dL (ref 3.6–4.8)
ALK PHOS: 56 IU/L (ref 39–117)
ALT: 16 IU/L (ref 0–32)
AST: 21 IU/L (ref 0–40)
Albumin/Globulin Ratio: 1.8 (ref 1.2–2.2)
BUN / CREAT RATIO: 14 (ref 12–28)
BUN: 10 mg/dL (ref 8–27)
Bilirubin Total: 0.4 mg/dL (ref 0.0–1.2)
CO2: 25 mmol/L (ref 20–29)
CREATININE: 0.71 mg/dL (ref 0.57–1.00)
Calcium: 9.8 mg/dL (ref 8.7–10.3)
Chloride: 102 mmol/L (ref 96–106)
GFR calc Af Amer: 105 mL/min/{1.73_m2} (ref >59)
GFR calc non Af Amer: 91 mL/min/{1.73_m2} (ref >59)
GLUCOSE: 82 mg/dL (ref 65–99)
Globulin, Total: 2.7 g/dL (ref 1.5–4.5)
Potassium: 3.8 mmol/L (ref 3.5–5.2)
SODIUM: 142 mmol/L (ref 134–144)
Total Protein: 7.5 g/dL (ref 6.0–8.5)

## 2017-07-03 LAB — TSH: TSH: 1.62 u[IU]/mL (ref 0.450–4.500)

## 2017-07-03 LAB — VITAMIN D 25 HYDROXY (VIT D DEFICIENCY, FRACTURES): VIT D 25 HYDROXY: 39.6 ng/mL (ref 30.0–100.0)

## 2017-08-24 ENCOUNTER — Other Ambulatory Visit: Payer: Self-pay | Admitting: Obstetrics & Gynecology

## 2017-08-24 ENCOUNTER — Telehealth: Payer: Self-pay | Admitting: Obstetrics & Gynecology

## 2017-08-24 MED ORDER — ESCITALOPRAM OXALATE 10 MG PO TABS: 10.0000 mg | ORAL_TABLET | Freq: Every day | ORAL | 3 refills | Status: DC

## 2017-08-24 NOTE — Telephone Encounter (Signed)
Patient notified of refill.   Encounter closed.  

## 2017-08-24 NOTE — Telephone Encounter (Signed)
Patient called to let Dr. Hyacinth MeekerMiller know that everything is going well with the Lexapro. Patient stated that she was given 1 refill, and told to call Dr. Hyacinth MeekerMiller and report back with how it goes. Patient would like refills for this.

## 2017-08-24 NOTE — Telephone Encounter (Signed)
Spoke with patient. Confirmed taking Lexapro 10 mg daily. Medication is working well, request refill. Confirmed pharmacy on file. Advised will notify once refill sent. Patient agreeable.    Dr. Hyacinth MeekerMiller -please advise on Lexapro refill.

## 2017-08-24 NOTE — Telephone Encounter (Signed)
Rx completed for 90 day supply with RFs and sent to pharmacy on file.

## 2017-09-21 ENCOUNTER — Telehealth: Payer: Self-pay | Admitting: Obstetrics & Gynecology

## 2017-09-21 NOTE — Telephone Encounter (Signed)
Pt wanted to see if we had blood type information in her chart as she does not know her blood type. She is planning at being screened to see if she can donate a kidney to her husband. Advised no blood type in file but that can be done with testing for screening. Wished patient well and advised to call back with any questions. Pt agreeable.  Encounter closed.

## 2017-09-21 NOTE — Telephone Encounter (Signed)
Patient would like to know her blood type. Can leave it in detailed message at [phone].

## 2017-11-13 ENCOUNTER — Other Ambulatory Visit: Payer: Self-pay | Admitting: Obstetrics & Gynecology

## 2017-11-13 DIAGNOSIS — Z1231 Encounter for screening mammogram for malignant neoplasm of breast: Secondary | ICD-10-CM

## 2017-12-17 DIAGNOSIS — E039 Hypothyroidism, unspecified: Secondary | ICD-10-CM | POA: Insufficient documentation

## 2017-12-31 ENCOUNTER — Ambulatory Visit
Admission: RE | Admit: 2017-12-31 | Discharge: 2017-12-31 | Disposition: A | Discharge status: Home / Self Care | Payer: BLUE CROSS/BLUE SHIELD | Source: Ambulatory Visit | Attending: Obstetrics & Gynecology | Admitting: Obstetrics & Gynecology

## 2017-12-31 DIAGNOSIS — Z1231 Encounter for screening mammogram for malignant neoplasm of breast: Secondary | ICD-10-CM

## 2018-07-17 ENCOUNTER — Other Ambulatory Visit: Payer: Self-pay | Admitting: Obstetrics & Gynecology

## 2018-07-17 NOTE — Telephone Encounter (Signed)
Medication refill request: Zocor Last AEX:  07/02/08 Next AEX: 10/17/18 Last MMG (if hormonal medication request): 12/31/17 Bi-rads 1 Neg  Refill authorized: #90 with 0 Rf

## 2018-07-18 ENCOUNTER — Other Ambulatory Visit: Payer: Self-pay | Admitting: Obstetrics & Gynecology

## 2018-07-19 NOTE — Telephone Encounter (Signed)
Medication refill request: Simvastatin 20 mg  Last AEX:  07/02/17 Next AEX: 10/17/18 Last MMG (if hormonal medication request): 12/31/17 B-rads 1 neg  Refill authorized: #90 with 0 Rf

## 2018-07-19 NOTE — Telephone Encounter (Signed)
Medication refill request: Euthyrox Last AEX:  07/02/17 Next AEX: 10/17/18  Last MMG (if hormonal medication request): 01/01/28  Bi-rads 1 neg  Refill authorized: #90 with 0 RF   Medication refill request: Hydrochlorothiazide Last AEX:  07/02/17 Next AEX: 10/17/18  Last MMG (if hormonal medication request): 01/01/28  Bi-rads 1 neg  Refill authorized: #90 with 0 RF

## 2018-07-21 NOTE — Telephone Encounter (Signed)
Please let pt know her PCP needs to do this RF.  I did this last year as she was establishing care with a new PCP.  Refill declined.

## 2018-07-21 NOTE — Telephone Encounter (Signed)
These need to be done by her PCP.  I've routed another note to you about this as well.  I did these last year for her as she was establishing care with a new PCP.  Thanks.

## 2018-07-22 ENCOUNTER — Other Ambulatory Visit: Payer: Self-pay | Admitting: Obstetrics & Gynecology

## 2018-07-22 NOTE — Telephone Encounter (Signed)
Have called patient to try to inform her that Dr. Sabra Heck said to have these filled with her primary care provider. No answer not able to leave voicemail.

## 2018-07-22 NOTE — Telephone Encounter (Signed)
Patient checking the status of refill request. °

## 2018-08-02 ENCOUNTER — Other Ambulatory Visit: Payer: Self-pay | Admitting: Obstetrics & Gynecology

## 2018-08-02 MED ORDER — HYDROCHLOROTHIAZIDE 25 MG PO TABS: 25.0000 mg | ORAL_TABLET | Freq: Every day | ORAL | 0 refills | Status: DC

## 2018-08-02 MED ORDER — SIMVASTATIN 20 MG PO TABS: 20.0000 mg | ORAL_TABLET | Freq: Every day | ORAL | 0 refills | Status: DC

## 2018-08-02 MED ORDER — LEVOTHYROXINE SODIUM 50 MCG PO TABS: 50.0000 ug | ORAL_TABLET | Freq: Every day | ORAL | 0 refills | Status: DC

## 2018-08-02 NOTE — Telephone Encounter (Signed)
She is going to need to work on establishing care with PCP.  I will do a 90 day supply for her and we can discuss further at her appt.

## 2018-08-02 NOTE — Telephone Encounter (Signed)
Patient is asking for refills on medications (levothyroxine, hydrochlorothiazide, simvastatin, and cholesterol medication). Patient is aware that she needs to have them filled by PCP, but states she does not have a PCP. The only two in her area that accept her insurance are not accepting new patients. States she has been without her cholesterol medication for 3 weeks.

## 2018-08-02 NOTE — Telephone Encounter (Signed)
Patient called requesting refill on following medications. Levothyroxine 65mcg, Simvastatin 20mg , Hydrochlorothiazide 25mg --90day supply. She has AEX 10-17-18. States she is having trouble finding a PCP in [place]. 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  Advised patient will discuss with Dr.Miller.  RXs pended--please sign if appropriate.  Routed to provider.

## 2018-10-15 ENCOUNTER — Telehealth: Payer: Self-pay | Admitting: Obstetrics & Gynecology

## 2018-10-15 ENCOUNTER — Other Ambulatory Visit: Payer: Self-pay

## 2018-10-15 NOTE — Telephone Encounter (Signed)
Spoke with patient. Patient is scheduled for AEX 10/17/18, requesting to move AEX to later date, provider not in network. Patient does not have a PCP. Currently taking simvastatin, levothyroxine and hydrochlorothiazide prescribed by Dr. Sabra Heck. Will need refills. No longer taking lasix or Lexapro. Spouse died 09-11-18. Patient has checked in to PCP in her area, states none are accepting new patients and she does not want to travel to another location.   Last AEX 07/02/17, Pap 01/11/16, no Hx of abnormal pap.   Advised patient would need update labs for refills of medication.  I will have to review with Dr. Sabra Heck on 10/14 and return call. Patient would like to keep AEX as scheduled until office returns call. Patient agreeable.   Dr. Sabra Heck -please review and advise.

## 2018-10-15 NOTE — Telephone Encounter (Signed)
Patient has her aex 10/17/18 with Dr.Miller. She asked if Dr.Miller would be okay with her rescheduling her aex to next year?

## 2018-10-16 NOTE — Telephone Encounter (Signed)
Call reviewed with Dr. Sabra Heck, call returned to patient. Conveyed condolences in loss of spouse. Patient states she contacted a previous PCP yesterday afternoon after our telephone conversation. Patient is working to schedule with PCP. She states she has enough medication until she can be seen with PCP, will not need refills. Patient request to cancel AEX with Dr. Sabra Heck for 10/15, will re-evaluate next year once she has a new insurance plan. Patient very appreciative of follow-up. Is aware to call if any assistance is needed.   Routing to provider for final review. Patient is agreeable to disposition. Will close encounter.

## 2018-10-17 ENCOUNTER — Ambulatory Visit: Payer: BLUE CROSS/BLUE SHIELD | Admitting: Obstetrics & Gynecology

## 2018-11-22 DIAGNOSIS — M75121 Complete rotator cuff tear or rupture of right shoulder, not specified as traumatic: Secondary | ICD-10-CM | POA: Insufficient documentation

## 2018-12-10 HISTORY — PX: ROTATOR CUFF REPAIR: SHX139

## 2019-02-06 DIAGNOSIS — S52532D Colles' fracture of left radius, subsequent encounter for closed fracture with routine healing: Secondary | ICD-10-CM | POA: Insufficient documentation

## 2019-03-10 ENCOUNTER — Other Ambulatory Visit: Payer: Self-pay

## 2019-03-11 ENCOUNTER — Ambulatory Visit: Payer: 59 | Admitting: Obstetrics & Gynecology

## 2019-03-11 ENCOUNTER — Other Ambulatory Visit (HOSPITAL_COMMUNITY)
Admission: RE | Admit: 2019-03-11 | Discharge: 2019-03-11 | Disposition: A | Discharge status: Home / Self Care | Payer: 59 | Source: Ambulatory Visit | Attending: Obstetrics & Gynecology | Admitting: Obstetrics & Gynecology

## 2019-03-11 ENCOUNTER — Encounter: Payer: Self-pay | Admitting: Obstetrics & Gynecology

## 2019-03-11 VITALS — BP 136/78 | HR 72 | Temp 97.3°F | Resp 10 | Ht 59.5 in | Wt 146.0 lb

## 2019-03-11 DIAGNOSIS — Z01419 Encounter for gynecological examination (general) (routine) without abnormal findings: Secondary | ICD-10-CM

## 2019-03-11 DIAGNOSIS — Z124 Encounter for screening for malignant neoplasm of cervix: Secondary | ICD-10-CM

## 2019-03-11 MED ORDER — ESCITALOPRAM OXALATE 10 MG PO TABS: 10.0000 mg | ORAL_TABLET | Freq: Every day | ORAL | 3 refills | Status: DC

## 2019-03-11 NOTE — Patient Instructions (Signed)
[facility] scheduling [phone]

## 2019-03-11 NOTE — Progress Notes (Signed)
64 y.o. G2P2 Widowed White or Caucasian female here for annual exam.  Has been a hard year.  Husband died in 11-Sep-2022.  He had a heart attack that was related to amyloidosis.  He had kidney failure and was having lung issues.  Has felt supported by family/friends.  She had rotator cuff injury and biceps injury that was repaired 12/10/18.  Fell while wearing sling.  Fractured radius.  Thankfully, she did not injure her rotator cuff repair.  She is currently doing PT but extension in shoulder is much better.  Sister with leukemia is now on hospice care.  Denies vaginal bleeding.    Patient's last menstrual period was 01/03/2008.          Sexually active: No.  The current method of family planning is tubal ligation and post menopausal status.    Exercising: No.  The patient does not participate in regular exercise at present. Smoker:  no  Health Maintenance: Pap:  01-11-16 neg HPV HR neg   11-04-13 neg HPV HR neg History of abnormal Pap:  no MMG:  12/31/17 BIRADS 1 negative/density b.  Aware this is due.  Didn't do because of rotator cuff injury. Colonoscopy:  05/2014 neg f/u 10yrs.  Aware this is due this year.   BMD:   2017 Osteopenia TDaP:  2018 Pneumonia vaccine(s):  never Shingrix:   never Hep C testing: Negative in 2019 Screening Labs: PCP   reports that she has never smoked. She has never used smokeless tobacco. She reports that she does not drink alcohol or use drugs.  Past Medical History:  Diagnosis Date  . Anemia   . Bell's palsy   . Chest pain 5/00   negative cardiac cath  . Elevated cholesterol   . Fibromyalgia   . Hypertension   . Influenza 01/2012  . Osteopenia   . Shingles     Past Surgical History:  Procedure Laterality Date  . APPENDECTOMY    . COLONOSCOPY    . FOOT SURGERY Right 11/2015  . LAPAROSCOPIC CHOLECYSTECTOMY    . NECK SURGERY  8/06   secondary disc degeneration  . SHOULDER SURGERY    . TUBAL LIGATION      Current Outpatient Medications   Medication Sig Dispense Refill  . Calcium Carbonate-Vit D-Min (CALCIUM 1200 PO) Take by mouth.    . cholecalciferol (VITAMIN D) 1000 units tablet Take 1,000 Units by mouth daily.    . hydrochlorothiazide (HYDRODIURIL) 25 MG tablet Take 1 tablet (25 mg total) by mouth daily. 90 tablet 4  . levothyroxine (SYNTHROID) 50 MCG tablet Take 1 tablet (50 mcg total) by mouth daily. 90 tablet 0  . simvastatin (ZOCOR) 20 MG tablet Take 1 tablet (20 mg total) by mouth daily. 90 tablet 4  . escitalopram (LEXAPRO) 10 MG tablet Take 1 tablet (10 mg total) by mouth daily. (Patient not taking: Reported on 03/11/2019) 90 tablet 3  . furosemide (LASIX) 20 MG tablet Take 1 tablet (20 mg total) by mouth as needed for up to 1 dose for fluid. (Patient not taking: Reported on 03/11/2019) 30 tablet 2   No current facility-administered medications for this visit.    Family History  Problem Relation Age of Onset  . Diabetes Mother   . Cancer Mother        ovarian, breast, liver-? primary site  . Irritable bowel syndrome Mother   . Breast cancer Maternal Aunt        and stomach cancer  . Lung cancer Father   .  Prostate cancer Paternal Grandfather   . Brain cancer Maternal Grandmother     Review of Systems  All other systems reviewed and are negative.   Exam:   BP 136/78 (BP Location: Right Arm, Patient Position: Sitting, Cuff Size: Normal)   Pulse 72   Temp (!) 97.3 F (36.3 C) (Temporal)   Resp 10   Ht 4' 11.5" (1.511 m)   Wt 146 lb (66.2 kg)   LMP 01/03/2008   BMI 28.99 kg/m    Height: 4' 11.5" (151.1 cm)  Ht Readings from Last 3 Encounters:  03/11/19 4' 11.5" (1.511 m)  07/02/17 4' 11.5" (1.511 m)  06/15/16 4' 11.5" (1.511 m)    General appearance: alert, cooperative and appears stated age Head: Normocephalic, without obvious abnormality, atraumatic Neck: no adenopathy, supple, symmetrical, trachea midline and thyroid normal to inspection and palpation Lungs: clear to auscultation  bilaterally Breasts: normal appearance, no masses or tenderness Heart: regular rate and rhythm Abdomen: soft, non-tender; bowel sounds normal; no masses,  no organomegaly Extremities: extremities normal, atraumatic, no cyanosis or edema Skin: Skin color, texture, turgor normal. No rashes or lesions Lymph nodes: Cervical, supraclavicular, and axillary nodes normal. No abnormal inguinal nodes palpated Neurologic: Grossly normal   Pelvic: External genitalia:  no lesions              Urethra:  normal appearing urethra with no masses, tenderness or lesions              Bartholins and Skenes: normal                 Vagina: normal appearing vagina with normal color and discharge, no lesions              Cervix: no lesions              Pap taken: Yes.   Bimanual Exam:  Uterus:  normal size, contour, position, consistency, mobility, non-tender              Adnexa: normal adnexa and no mass, fullness, tenderness               Rectovaginal: Confirms               Anus:  normal sphincter tone, no lesions  Chaperone, Terence Lux, CMA, was present for exam.  A:  Well Woman with normal exam PMP, no HRT Osteopenia  Elevated lipids Hypothyroidism  Hypertension IBS Anxiety/depression  P:   Mammogram guidelines reviewed.  Aware this is due.  Hasn't had done due to shoulder injury and left wrist fracture pap smear with HR HPV obtained Lab work done with PCP BMD will planned next year RF Lexapro 10mg  daily.  #30/12RF Colonoscopy is due after 05/2019.  She has not received any notice about follow up yet.   Return annually or prn

## 2019-03-12 LAB — CYTOLOGY - PAP
Comment: NEGATIVE
Diagnosis: NEGATIVE
High risk HPV: NEGATIVE

## 2019-04-18 ENCOUNTER — Telehealth: Payer: Self-pay

## 2019-04-18 NOTE — Telephone Encounter (Signed)
Patient called in regards to medication needing to be refilled. Patient stated she was told a years worth of refills, every 90 days would be sent to [facility] in [place] , [place].  The medications are Hydrodiuril, Synthroid, and Zocor.

## 2019-04-18 NOTE — Telephone Encounter (Signed)
Call to patient. Left message to call back to triage nurse.  Last annual exam 03-11-19. No recent labs noted in Epic.

## 2019-04-21 MED ORDER — SIMVASTATIN 20 MG PO TABS: 20.0000 mg | ORAL_TABLET | Freq: Every day | ORAL | 0 refills | Status: AC

## 2019-04-21 MED ORDER — LEVOTHYROXINE SODIUM 50 MCG PO TABS: 50.0000 ug | ORAL_TABLET | Freq: Every day | ORAL | 0 refills | Status: AC

## 2019-04-21 MED ORDER — HYDROCHLOROTHIAZIDE 25 MG PO TABS: 25.0000 mg | ORAL_TABLET | Freq: Every day | ORAL | 0 refills | Status: AC

## 2019-04-21 NOTE — Telephone Encounter (Signed)
Left detailed message per DPR and pt's request. Pt updated on med refills and recommendations per Dr Hyacinth Meeker. Pt to call back to office if any further questions or concerns.   Addendum closed.

## 2019-04-21 NOTE — Telephone Encounter (Signed)
Spoke with pt. AEX 03/11/19. No recent labs in Epic. Last TSH 07/2017= 1.620  Pt requesting 90 day supply of HCTZ, Synthroid and Zocor due to changing insurance from [facility] to Medicare starting May 1.   Pt states has 3 day supply left of each medication. If can have 90 supply with 0RF, then pt states will give time to get medicare set up and then make appt with PCP Dr Laymond Purser. Will discuss with Dr Hyacinth Meeker and return call to pt. Pt agreeable.   Routing to Dr Hyacinth Meeker for medication request.

## 2019-04-21 NOTE — Telephone Encounter (Signed)
Patient returned a call to triage. 

## 2019-04-21 NOTE — Telephone Encounter (Signed)
90 day supply for HCTZ, synthroid and zocor sent to [facility] pharmacy on file.  She does need updated lab work with her PCP as well.  The Zocor does not look like it is on the preferred medication list for her, just FYI.  She will need to wait until sees new PCP for change in medication a lot more expense.  Thanks.

## 2020-04-12 ENCOUNTER — Other Ambulatory Visit: Payer: Self-pay

## 2020-04-12 ENCOUNTER — Encounter (HOSPITAL_BASED_OUTPATIENT_CLINIC_OR_DEPARTMENT_OTHER): Payer: Self-pay | Admitting: Obstetrics & Gynecology

## 2020-04-12 ENCOUNTER — Ambulatory Visit (INDEPENDENT_AMBULATORY_CARE_PROVIDER_SITE_OTHER): Payer: Medicare Other | Admitting: Obstetrics & Gynecology

## 2020-04-12 VITALS — BP 140/85 | HR 66 | Resp 20 | Ht 59.0 in | Wt 148.0 lb

## 2020-04-12 DIAGNOSIS — E039 Hypothyroidism, unspecified: Secondary | ICD-10-CM

## 2020-04-12 DIAGNOSIS — Z01419 Encounter for gynecological examination (general) (routine) without abnormal findings: Secondary | ICD-10-CM | POA: Diagnosis not present

## 2020-04-12 DIAGNOSIS — Z780 Asymptomatic menopausal state: Secondary | ICD-10-CM | POA: Diagnosis not present

## 2020-04-12 DIAGNOSIS — N952 Postmenopausal atrophic vaginitis: Secondary | ICD-10-CM

## 2020-04-12 NOTE — Progress Notes (Addendum)
65 y.o. G2P2 Widowed White or Caucasian female here for breast and pelvic exam.  Husband passed almost two years ago.  Sister died last year due to leukemia.  She was 68.  She's come off the lexapro.  Says she just trying to stay busy.  She feels she is coping well.   Denies vaginal bleeding.  Patient's last menstrual period was 01/03/2008.          Sexually active: No.  H/O STD:  no  Health Maintenance: PCP:  Dr. Laymond Purser.  Last wellness appt was 11/2019.  Did blood work at that appt:  Yes Vaccines are up to date:  Pt is unsure about the pneumovax Colonoscopy:  2016, follow up 10 years MMG:  01/2020, [facility] BMD:  01/2020, [facility] Last pap smear:  03/2019.   H/o abnormal pap smear: no   reports that she has never smoked. She has never used smokeless tobacco. She reports that she does not drink alcohol and does not use drugs.  Past Medical History:  Diagnosis Date  . Anemia   . Bell's palsy   . Chest pain 5/00   negative cardiac cath  . Elevated cholesterol   . Fibromyalgia   . Hypertension   . Influenza 01/2012  . Osteopenia   . Shingles     Past Surgical History:  Procedure Laterality Date  . APPENDECTOMY    . COLONOSCOPY    . FOOT SURGERY Right 11/2015  . LAPAROSCOPIC CHOLECYSTECTOMY    . NECK SURGERY  8/06   secondary disc degeneration  . ROTATOR CUFF REPAIR Right 12/10/2018   with biceps tendon repair  . SHOULDER SURGERY    . TUBAL LIGATION      Current Outpatient Medications  Medication Sig Dispense Refill  . cholecalciferol (VITAMIN D) 1000 units tablet Take 1,000 Units by mouth daily.    . hydrochlorothiazide (HYDRODIURIL) 25 MG tablet Take 1 tablet (25 mg total) by mouth daily. 90 tablet 0  . levothyroxine (SYNTHROID) 50 MCG tablet Take 1 tablet (50 mcg total) by mouth daily. 90 tablet 0  . simvastatin (ZOCOR) 20 MG tablet Take 1 tablet (20 mg total) by mouth daily. 90 tablet 0   No current facility-administered medications for this  visit.    Family History  Problem Relation Age of Onset  . Diabetes Mother   . Cancer Mother        ovarian, breast, liver-? primary site  . Irritable bowel syndrome Mother   . Breast cancer Maternal Aunt        and stomach cancer  . Lung cancer Father   . Prostate cancer Paternal Grandfather   . Brain cancer Maternal Grandmother   . Leukemia Sister     Review of Systems  All other systems reviewed and are negative.   Exam:   BP 140/85   Pulse 66   Resp 20   Ht 4\' 11"  (1.499 m)   Wt 148 lb (67.1 kg)   LMP 01/03/2008   BMI 29.89 kg/m   Height: 4\' 11"  (149.9 cm)  General appearance: alert, cooperative and appears stated age Breasts: normal appearance, no masses or tenderness Abdomen: soft, non-tender; bowel sounds normal; no masses,  no organomegaly Lymph nodes: Cervical, supraclavicular, and axillary nodes normal.  No abnormal inguinal nodes palpated Neurologic: Grossly normal  Pelvic: External genitalia:  no lesions              Urethra:  normal appearing urethra with no masses, tenderness or lesions  Bartholins and Skenes: normal                 Vagina: normal appearing vagina with atrophic changes and no discharge, no lesions              Cervix: no lesions              Pap taken: No. Bimanual Exam:  Uterus:  normal size, contour, position, consistency, mobility, non-tender              Adnexa: normal adnexa and no mass, fullness, tenderness               Rectovaginal: Confirms               Anus:  normal sphincter tone, no lesions  Chaperone, Francene Finders, CMA, was present for exam.  Assessment/Plan: 1. Encntr for gyn exam (general) (routine) w/o abn findings - pap neg 2021 - release for MMG and BMD signed today - colonoscopy negative 2016 - lab work done with Dr. Laymond Purser  - vaccines reviewed.  Pt aware she needs to have the pneumovax vaccination  2. Postmenopausal - no HRT  3. Vaginal atrophy - over the counter products and prescription  options discussed.  She is going to just start using coconut oil if needed.  4. Grief reaction  Total time with pt:  25 min

## 2020-04-12 NOTE — Patient Instructions (Signed)
Ask about pneumonia vaccination.

## 2020-06-11 ENCOUNTER — Ambulatory Visit: Payer: 59

## 2021-04-19 ENCOUNTER — Ambulatory Visit (HOSPITAL_BASED_OUTPATIENT_CLINIC_OR_DEPARTMENT_OTHER): Payer: Medicare Other | Admitting: Obstetrics & Gynecology
